# Patient Record
Sex: Male | Born: 1970 | Race: Black or African American | Hispanic: No | Marital: Single | Smoking: Current every day smoker
Health system: Southern US, Community
[De-identification: ages and names within clinical notes are randomized; demographics above are authoritative.]

## PROBLEM LIST (undated history)

## (undated) DIAGNOSIS — F1911 Other psychoactive substance abuse, in remission: Secondary | ICD-10-CM

## (undated) HISTORY — DX: Other psychoactive substance abuse, in remission: F19.11

## (undated) HISTORY — PX: INGUINAL HERNIA REPAIR: SUR1180

---

## 2007-02-05 ENCOUNTER — Emergency Department (HOSPITAL_COMMUNITY): Admission: EM | Admit: 2007-02-05 | Discharge: 2007-02-05 | Payer: Self-pay | Admitting: Emergency Medicine

## 2014-09-10 ENCOUNTER — Emergency Department (INDEPENDENT_AMBULATORY_CARE_PROVIDER_SITE_OTHER): Payer: Self-pay

## 2014-09-10 ENCOUNTER — Encounter (HOSPITAL_COMMUNITY): Payer: Self-pay | Admitting: Emergency Medicine

## 2014-09-10 ENCOUNTER — Emergency Department (INDEPENDENT_AMBULATORY_CARE_PROVIDER_SITE_OTHER)
Admission: EM | Admit: 2014-09-10 | Discharge: 2014-09-10 | Disposition: A | Discharge status: Home / Self Care | Payer: Self-pay | Source: Home / Self Care | Attending: Emergency Medicine | Admitting: Emergency Medicine

## 2014-09-10 DIAGNOSIS — J0140 Acute pansinusitis, unspecified: Secondary | ICD-10-CM

## 2014-09-10 DIAGNOSIS — J209 Acute bronchitis, unspecified: Secondary | ICD-10-CM

## 2014-09-10 MED ORDER — AZITHROMYCIN 250 MG PO TABS: ORAL_TABLET | ORAL | Status: DC

## 2014-09-10 MED ORDER — PREDNISONE 50 MG PO TABS: ORAL_TABLET | ORAL | Status: DC

## 2014-09-10 NOTE — ED Provider Notes (Signed)
CSN: [id number]     Arrival date & time 09/10/14  1857 History   First MD Initiated Contact with Patient 09/10/14 1951     Chief Complaint  Patient presents with  . URI   (Consider location/radiation/quality/duration/timing/severity/associated sxs/prior Treatment) HPI  He is a 44 year old man here for evaluation of cough. He states he's had a cold for the last 2 or 3 weeks. In the last few days he has developed bloody nasal discharge and headache. He also reports some blood-tinged sputum. He denies any chest pain or shortness of breath. He does report some fatigue. No fevers or chills. No nausea or vomiting. He is a current smoker, smoking 1 pack per day.  History reviewed. No pertinent past medical history. History reviewed. No pertinent past surgical history. No family history on file. History  Substance Use Topics  . Smoking status: Former Games developermoker  . Smokeless tobacco: Not on file  . Alcohol Use: 4.2 oz/week    7 Standard drinks or equivalent per week    Review of Systems  Constitutional: Positive for fatigue. Negative for fever, chills and appetite change.  HENT: Positive for congestion. Negative for rhinorrhea, sinus pressure and sore throat.   Respiratory: Positive for cough. Negative for shortness of breath.   Cardiovascular: Negative for chest pain.  Gastrointestinal: Negative for nausea and vomiting.  Musculoskeletal: Negative for myalgias.  Neurological: Positive for headaches.    Allergies  Review of patient's allergies indicates no known allergies.  Home Medications   Prior to Admission medications   Medication Sig Start Date End Date Taking? Authorizing Provider  azithromycin (ZITHROMAX Z-PAK) 250 MG tablet Take 2 pills today, then 1 pill daily until gone. 09/10/14   Charm RingsErin J Shinichi Anguiano, MD  predniSONE (DELTASONE) 50 MG tablet Take 1 pill daily for 5 days. 09/10/14   Charm RingsErin J Raunak Antuna, MD   BP 112/77 mmHg  Pulse 68  Temp(Src) 98.8 F (37.1 C) (Oral)  Resp 18  SpO2  97% Physical Exam  Constitutional: He is oriented to person, place, and time. He appears well-developed and well-nourished. No distress.  HENT:  Mouth/Throat: No oropharyngeal exudate.  Mild erythema of oropharynx. Nasal mucosa is erythematous and swollen.  Neck: Neck supple.  Cardiovascular: Normal rate, regular rhythm and normal heart sounds.   No murmur heard. Pulmonary/Chest: Effort normal. No respiratory distress. He has no wheezes. He has no rales.  Lymphadenopathy:    He has no cervical adenopathy.  Neurological: He is alert and oriented to person, place, and time.    ED Course  Procedures (including critical care time) Labs Review Labs Reviewed - No data to display  Imaging Review Dg Chest 2 View  09/10/2014   CLINICAL DATA:  For 3 weeks.  Head congestion.  Smoker.  EXAM: CHEST  2 VIEW  COMPARISON:  None.  FINDINGS: The heart size and mediastinal contours are within normal limits. Both lungs are clear. The visualized skeletal structures are unremarkable.  IMPRESSION: No active cardiopulmonary disease.   Electronically Signed   By: Burman NievesWilliam  Stevens M.D.   On: 09/10/2014 20:59     MDM   1. Acute bronchitis, unspecified organism   2. Acute pansinusitis, recurrence not specified    Treat with prednisone and azithromycin. Discussed smoking cessation. Return precautions reviewed.    Charm RingsErin J Elmon Shader, MD 09/10/14 2103

## 2014-09-10 NOTE — ED Notes (Signed)
Patient c/o sx including rhinorrhea with episodes of epistaxis and a productive cough with phlegm and blood x 3 days. Patient is in NAD.

## 2014-09-10 NOTE — Discharge Instructions (Signed)
You have bronchitis and a sinus infection. Take prednisone and azithromycin as prescribed. Please stop smoking. If you develop difficulty breathing or fevers please go to the emergency room.   Acute Bronchitis Bronchitis is when the airways that extend from the windpipe into the lungs get red, puffy, and painful (inflamed). Bronchitis often causes thick spit (mucus) to develop. This leads to a cough. A cough is the most common symptom of bronchitis. In acute bronchitis, the condition usually begins suddenly and goes away over time (usually in 2 weeks). Smoking, allergies, and asthma can make bronchitis worse. Repeated episodes of bronchitis may cause more lung problems. HOME CARE  Rest.  Drink enough fluids to keep your pee (urine) clear or pale yellow (unless you need to limit fluids as told by your doctor).  Only take over-the-counter or prescription medicines as told by your doctor.  Avoid smoking and secondhand smoke. These can make bronchitis worse. If you are a smoker, think about using nicotine gum or skin patches. Quitting smoking will help your lungs heal faster.  Reduce the chance of getting bronchitis again by:  Washing your hands often.  Avoiding people with cold symptoms.  Trying not to touch your hands to your mouth, nose, or eyes.  Follow up with your doctor as told. GET HELP IF: Your symptoms do not improve after 1 week of treatment. Symptoms include:  Cough.  Fever.  Coughing up thick spit.  Body aches.  Chest congestion.  Chills.  Shortness of breath.  Sore throat. GET HELP RIGHT AWAY IF:   You have an increased fever.  You have chills.  You have severe shortness of breath.  You have bloody thick spit (sputum).  You throw up (vomit) often.  You lose too much body fluid (dehydration).  You have a severe headache.  You faint. MAKE SURE YOU:   Understand these instructions.  Will watch your condition.  Will get help right away if you  are not doing well or get worse. Document Released: 11/07/2007 Document Revised: 01/21/2013 Document Reviewed: 11/11/2012 [facility] Patient Information 2015 [place], [place]. This information is not intended to replace advice given to you by your health care provider. Make sure you discuss any questions you have with your health care provider.

## 2014-11-24 ENCOUNTER — Ambulatory Visit (INDEPENDENT_AMBULATORY_CARE_PROVIDER_SITE_OTHER): Payer: No Typology Code available for payment source | Admitting: Medical

## 2014-11-24 ENCOUNTER — Other Ambulatory Visit: Payer: Self-pay | Admitting: Medical

## 2014-11-24 ENCOUNTER — Encounter: Payer: Self-pay | Admitting: Medical

## 2014-11-24 VITALS — BP 112/70 | HR 85 | Temp 98.1°F | Resp 15 | Wt 221.0 lb

## 2014-11-24 DIAGNOSIS — A630 Anogenital (venereal) warts: Secondary | ICD-10-CM

## 2014-11-24 DIAGNOSIS — R310 Gross hematuria: Secondary | ICD-10-CM | POA: Diagnosis not present

## 2014-11-24 DIAGNOSIS — R634 Abnormal weight loss: Secondary | ICD-10-CM | POA: Diagnosis not present

## 2014-11-24 DIAGNOSIS — R042 Hemoptysis: Secondary | ICD-10-CM

## 2014-11-24 DIAGNOSIS — F17200 Nicotine dependence, unspecified, uncomplicated: Secondary | ICD-10-CM

## 2014-11-24 DIAGNOSIS — N62 Hypertrophy of breast: Secondary | ICD-10-CM

## 2014-11-24 LAB — COMPREHENSIVE METABOLIC PANEL
ALK PHOS: 96 U/L (ref 39–117)
ALT: 18 U/L (ref 0–53)
AST: 19 U/L (ref 0–37)
Albumin: 4.3 g/dL (ref 3.5–5.2)
BILIRUBIN TOTAL: 0.4 mg/dL (ref 0.2–1.2)
BUN: 8 mg/dL (ref 6–23)
CO2: 24 meq/L (ref 19–32)
CREATININE: 1.01 mg/dL (ref 0.50–1.35)
Calcium: 9.1 mg/dL (ref 8.4–10.5)
Chloride: 106 mEq/L (ref 96–112)
GLUCOSE: 79 mg/dL (ref 70–99)
Potassium: 4 mEq/L (ref 3.5–5.3)
Sodium: 142 mEq/L (ref 135–145)
TOTAL PROTEIN: 7 g/dL (ref 6.0–8.3)

## 2014-11-24 LAB — POCT URINALYSIS DIPSTICK
Bilirubin, UA: NEGATIVE
Blood, UA: NEGATIVE
GLUCOSE UA: NEGATIVE
Leukocytes, UA: NEGATIVE
NITRITE UA: NEGATIVE
PH UA: 6
Spec Grav, UA: >=1.03
UROBILINOGEN UA: 1

## 2014-11-24 LAB — CBC WITH DIFFERENTIAL/PLATELET
Basophils Absolute: 0 10*3/uL (ref 0.0–0.1)
Basophils Relative: 0 % (ref 0–1)
Eosinophils Absolute: 0.1 10*3/uL (ref 0.0–0.7)
Eosinophils Relative: 1 % (ref 0–5)
HEMATOCRIT: 44.5 % (ref 39.0–52.0)
HEMOGLOBIN: 15.1 g/dL (ref 13.0–17.0)
Lymphocytes Relative: 34 % (ref 12–46)
Lymphs Abs: 4 10*3/uL (ref 0.7–4.0)
MCH: 30.8 pg (ref 26.0–34.0)
MCHC: 33.9 g/dL (ref 30.0–36.0)
MCV: 90.6 fL (ref 78.0–100.0)
MONO ABS: 0.7 10*3/uL (ref 0.1–1.0)
MPV: 10.6 fL (ref 8.6–12.4)
Monocytes Relative: 6 % (ref 3–12)
NEUTROS ABS: 7 10*3/uL (ref 1.7–7.7)
Neutrophils Relative %: 59 % (ref 43–77)
PLATELETS: 266 10*3/uL (ref 150–400)
RBC: 4.91 MIL/uL (ref 4.22–5.81)
RDW: 14.7 % (ref 11.5–15.5)
WBC: 11.8 10*3/uL — AB (ref 4.0–10.5)

## 2014-11-24 LAB — TSH: TSH: 0.274 u[IU]/mL — ABNORMAL LOW (ref 0.350–4.500)

## 2014-11-24 NOTE — Progress Notes (Signed)
Subjective: Here as a new patient today.  I have seen his daughter as a patient.  He notes prior being healthy until recently, has some concerns.     Here today for concerns.  He notes weight loss in recent months.   Was 268 lb 03/2014.   He thinks he was around same weight 07/2014.  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  Doesn't exercise in general other than physical at work.   Exercised in the past when incarcerated to occupy time.    He does note coughing up blood a month ago.  It was blood tinged phlegm and nothing else.   He does urinating blood a few times about a month ago.  Still has cough now daily since several months.  Smokes about 2 packs per day.  Started back smoking about 9 months, but had quit 10 months . Been smoking 24 years, primarily was 1ppd for 5 of the years, and was less than that for most of the time.     Has a big mole of right side of genital area that has gotten much bigger this past years, bled at one point, saw a doctor and was given cream to put on there.    Review of Systems Constitutional: -fever, -chills, +sweats, +unexpected weight change,+fatigue ENT: -runny nose, -ear pain, -sore throat Cardiology:  -chest pain, -palpitations, -edema Respiratory: +cough, -shortness of breath, -wheezing Gastroenterology: +hungry a lot, -abdominal pain, -nausea, -vomiting, -diarrhea, -constipation  Hematology: -bleeding or bruising problems Musculoskeletal: +leg cramping every night, intermittent, low back pain, no joint swelling Ophthalmology: -vision changes Urology: -dysuria, -difficulty urinating, +hematuria, -urinary frequency, -urgency Neurology: -headache, -weakness, -tingling, -numbness   History reviewed. No pertinent past medical history.  Past Surgical History  Procedure Laterality Date  . Inguinal hernia repair      right, as child    History   Social History  .  Marital Status: Single    Spouse Name: N/A  . Number of Children: N/A  . Years of Education: N/A   Occupational History  . Not on file.   Social History Main Topics  . Smoking status: Current Every Day Smoker  . Smokeless tobacco: Not on file  . Alcohol Use: 4.2 oz/week    7 Standard drinks or equivalent per week  . Drug Use: Yes    Special: Marijuana  . Sexual Activity: Not on file   Other Topics Concern  . Not on file   Social History Narrative   Married, has daughter, drinks 1/5th of liquor every 2 days, Christian, Psychologist, prison and probation services.      Family History  Problem Relation Age of Onset  . Other Father     AIDS?  . Drug abuse Sister     died of overdose     Current outpatient prescriptions:  .  azithromycin (ZITHROMAX Z-PAK) 250 MG tablet, Take 2 pills today, then 1 pill daily until gone. (Patient not taking: Reported on 11/24/2014), Disp: 6 tablet, Rfl: 0 .  predniSONE (DELTASONE) 50 MG tablet, Take 1 pill daily for 5 days. (Patient not taking: Reported on 11/24/2014), Disp: 5 tablet, Rfl: 0  No Known Allergies     Objective: BP 112/70 mmHg  Pulse 85  Temp(Src) 98.1 F (36.7 C) (Oral)  Resp 15  Wt 221 lb (100.245 kg)  General appearance: alert, no distress, WD/WN, AA male HEENT: normocephalic, sclerae anicteric, TMs pearly, nares patent,  no discharge or erythema, pharynx normal Oral cavity: MMM, no lesions Neck: supple, no lymphadenopathy, no thyromegaly, no masses Heart: RRR, normal S1, S2, no murmurs Lungs: CTA bilaterally, no wheezes, rhonchi, or rales Abdomen: +bs, soft, non tender, non distended, no masses, no hepatomegaly, no splenomegaly Pulses: 2+ symmetric, upper and lower extremities, normal cap refill GU: right inguinal surgical scar, large pedunculated 4cm x 5cm verrucal lesion with 3cm base of right groin adjacent to shaft of penis, circumcised, no testicular mass, no hernia, no swelling, no other rash or skin lesion DRE: small external  hemorrhoid, prostate seems a little enlarged but nodules.  Occult negative stool Skin: there is a patch flat small brown macule on right buttock posteriorly, 3-82mm diameter, probable birth mark per patient   Assessment: Encounter Diagnoses  Name Primary?  Marland Kitchen Unexplained weight loss Yes  . Gross hematuria   . Hemoptysis   . Heavy smoker   . Genital warts      Plan: discussed his symptoms, unexplained weight loss,gross hematuria, hemoptysis, risk factors for worrisome etiologies given AA heavy smoker and somewhat frequent alcohol use.   Need to rule out malignancy. Strongly advised he STOP tobacco and cut back alcohol significantly.  Labs, urine and CXR today.  Will likely need other eval as well.  F/u pending initial tests.   Genital warts - can address this at later date, but there is a large right warty lesion adjacent to base of penis suggestive of viral wart.

## 2014-11-25 ENCOUNTER — Other Ambulatory Visit: Payer: Self-pay | Admitting: Medical

## 2014-11-25 DIAGNOSIS — R946 Abnormal results of thyroid function studies: Secondary | ICD-10-CM

## 2014-11-25 LAB — PSA: PSA: 0.71 ng/mL (ref <=4.00)

## 2014-11-26 LAB — T4, FREE: Free T4: 1.04 ng/dL (ref 0.80–1.80)

## 2014-12-09 ENCOUNTER — Ambulatory Visit
Admission: RE | Admit: 2014-12-09 | Discharge: 2014-12-09 | Disposition: A | Discharge status: Home / Self Care | Payer: No Typology Code available for payment source | Source: Ambulatory Visit | Attending: Medical | Admitting: Medical

## 2014-12-09 DIAGNOSIS — R042 Hemoptysis: Secondary | ICD-10-CM

## 2014-12-09 DIAGNOSIS — F17200 Nicotine dependence, unspecified, uncomplicated: Secondary | ICD-10-CM

## 2014-12-09 DIAGNOSIS — A630 Anogenital (venereal) warts: Secondary | ICD-10-CM

## 2014-12-09 DIAGNOSIS — R310 Gross hematuria: Secondary | ICD-10-CM

## 2014-12-09 DIAGNOSIS — R634 Abnormal weight loss: Secondary | ICD-10-CM

## 2014-12-13 ENCOUNTER — Ambulatory Visit (INDEPENDENT_AMBULATORY_CARE_PROVIDER_SITE_OTHER): Payer: No Typology Code available for payment source | Admitting: Medical

## 2014-12-13 ENCOUNTER — Encounter: Payer: Self-pay | Admitting: Medical

## 2014-12-13 VITALS — BP 122/84 | HR 76 | Temp 98.0°F | Resp 15 | Wt 219.0 lb

## 2014-12-13 DIAGNOSIS — R059 Cough, unspecified: Secondary | ICD-10-CM

## 2014-12-13 DIAGNOSIS — R319 Hematuria, unspecified: Secondary | ICD-10-CM

## 2014-12-13 DIAGNOSIS — N62 Hypertrophy of breast: Secondary | ICD-10-CM

## 2014-12-13 DIAGNOSIS — R634 Abnormal weight loss: Secondary | ICD-10-CM | POA: Diagnosis not present

## 2014-12-13 DIAGNOSIS — R05 Cough: Secondary | ICD-10-CM | POA: Diagnosis not present

## 2014-12-13 LAB — TSH: TSH: 0.396 u[IU]/mL (ref 0.350–4.500)

## 2014-12-13 LAB — POCT URINALYSIS DIPSTICK
Bilirubin, UA: NEGATIVE
Glucose, UA: NEGATIVE
Ketones, UA: NEGATIVE
Leukocytes, UA: NEGATIVE
Nitrite, UA: NEGATIVE
RBC UA: NEGATIVE
Spec Grav, UA: >=1.03
Urobilinogen, UA: 0.2
pH, UA: 6

## 2014-12-13 LAB — T4, FREE: Free T4: 0.87 ng/dL (ref 0.80–1.80)

## 2014-12-13 NOTE — Progress Notes (Signed)
Subjective: Here for recheck on unexplained weight loss . At last visit we had checked some labs and chest xray, white count was elevated, TSH decreased, but rest of labs were ok.  He notes weight loss in recent months.   Was 268 lb 03/2014.   He thinks he was around same weight 07/2014.  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  Doesn't exercise in general other than physical at work.   Exercised in the past when incarcerated to occupy time.   He does note coughing up blood a 2 months ago.  It was blood tinged phlegm and nothing else.   He does urinating blood a few times about a month ago.  Still has cough now daily since several months.  Smokes about 2 packs per day.  Started back smoking about 9 months, but had quit 10 months . Been smoking 24 years, primarily was 1ppd for 5 of the years, and was less than that for most of the time.   Since last visit has ongoing cough, but no residual hemoptysis or blood in urine.  No other aggravating or relieving factors. No other complaint.  Review of Systems Constitutional: -fever, -chills, +sweats, +unexpected weight change,+fatigue ENT: -runny nose, -ear pain, -sore throat Cardiology:  -chest pain, -palpitations, -edema Respiratory: +cough, -shortness of breath, -wheezing Gastroenterology: +hungry a lot, -abdominal pain, -nausea, -vomiting, -diarrhea, -constipation  Hematology: -bleeding or bruising problems Musculoskeletal: +leg cramping every night, intermittent, low back pain, no joint swelling Ophthalmology: -vision changes Urology: -dysuria, -difficulty urinating, +hematuria, -urinary frequency, -urgency Neurology: -headache, -weakness, -tingling, -numbness   No past medical history on file.  Past Surgical History  Procedure Laterality Date  . Inguinal hernia repair      right, as child    History   Social History  . Marital Status: Single    Spouse Name:  N/A  . Number of Children: N/A  . Years of Education: N/A   Occupational History  . Not on file.   Social History Main Topics  . Smoking status: Current Every Day Smoker  . Smokeless tobacco: Not on file  . Alcohol Use: 4.2 oz/week    7 Standard drinks or equivalent per week  . Drug Use: Yes    Special: Marijuana  . Sexual Activity: Not on file   Other Topics Concern  . Not on file   Social History Narrative   Married, has daughter, drinks 1/5th of liquor every 2 days, Christian, Psychologist, prison and probation services.      Family History  Problem Relation Age of Onset  . Other Father     AIDS?  . Drug abuse Sister     died of overdose    No current outpatient prescriptions on file.  No Known Allergies     Objective: BP 122/84 mmHg  Pulse 76  Temp(Src) 98 F (36.7 C) (Oral)  Resp 15  Wt 219 lb (99.338 kg)  Wt Readings from Last 3 Encounters:  12/13/14 219 lb (99.338 kg)  11/24/14 221 lb (100.245 kg)   General appearance: alert, no distress, WD/WN, AA male HEENT: normocephalic, sclerae anicteric, TMs pearly, nares patent, no discharge or erythema, pharynx normal Oral cavity: MMM, no lesions Neck: supple, no lymphadenopathy, no thyromegaly, no masses Heart: RRR, normal S1, S2, no murmurs Lungs: CTA bilaterally, no wheezes, rhonchi, or rales Abdomen: +bs, soft, non tender, non distended, no masses, no hepatomegaly, no splenomegaly Pulses: 2+ symmetric,  upper and lower extremities, normal cap refill Breasts - fatty tissue of both breasts under areola suggestive of gynecomastia, otherwise no nodules or lumps, no axillary or supraclavicular lymphadenopathy  Assessment: Encounter Diagnoses  Name Primary?  . Loss of weight Yes  . Gynecomastia   . Blood in urine   . Cough     Plan: Discussed his symptoms, unexplained weight loss,gross hematuria, hemoptysis, risk factors for worrisome etiologies given AA heavy smoker and somewhat frequent alcohol use.  Reviewed chest xray and  labs from last visit.  Strongly advised he STOP tobacco and cut back alcohol significantly. additional labs today, and consider mammogram, consider pulmonology referral, other imaging such as CT Chest/abdomen/pelvis.   F/u pending labs.  Robert Phelps was seen today for follow up weight loss and labs.  Diagnoses and all orders for this visit:  Loss of weight Orders: -     POCT urinalysis dipstick -     CBC -     T4, Free -     TSH -     FSH/LH -     Prolactin -     Testosterone  Gynecomastia Orders: -     POCT urinalysis dipstick -     CBC -     T4, Free -     TSH -     FSH/LH -     Prolactin -     Testosterone  Blood in urine Orders: -     POCT urinalysis dipstick -     CBC -     T4, Free -     TSH -     FSH/LH -     Prolactin -     Testosterone  Cough

## 2014-12-14 ENCOUNTER — Other Ambulatory Visit: Payer: Self-pay | Admitting: Medical

## 2014-12-14 DIAGNOSIS — R059 Cough, unspecified: Secondary | ICD-10-CM

## 2014-12-14 DIAGNOSIS — R05 Cough: Secondary | ICD-10-CM

## 2014-12-14 DIAGNOSIS — F17200 Nicotine dependence, unspecified, uncomplicated: Secondary | ICD-10-CM

## 2014-12-14 DIAGNOSIS — R042 Hemoptysis: Secondary | ICD-10-CM

## 2014-12-14 DIAGNOSIS — R634 Abnormal weight loss: Secondary | ICD-10-CM

## 2014-12-14 LAB — FSH/LH
FSH: 1.1 m[IU]/mL — AB (ref 1.4–18.1)
LH: 4.1 m[IU]/mL (ref 1.5–9.3)

## 2014-12-14 LAB — CBC
HCT: 46.6 % (ref 39.0–52.0)
Hemoglobin: 15.7 g/dL (ref 13.0–17.0)
MCH: 31.4 pg (ref 26.0–34.0)
MCHC: 33.7 g/dL (ref 30.0–36.0)
MCV: 93.2 fL (ref 78.0–100.0)
MPV: 10.4 fL (ref 8.6–12.4)
Platelets: 240 10*3/uL (ref 150–400)
RBC: 5 MIL/uL (ref 4.22–5.81)
RDW: 14.7 % (ref 11.5–15.5)
WBC: 10.8 10*3/uL — ABNORMAL HIGH (ref 4.0–10.5)

## 2014-12-14 LAB — PROLACTIN: Prolactin: 18 ng/mL — ABNORMAL HIGH (ref 2.1–17.1)

## 2014-12-14 LAB — TESTOSTERONE: TESTOSTERONE: 215 ng/dL — AB (ref 300–890)

## 2014-12-17 ENCOUNTER — Other Ambulatory Visit: Payer: No Typology Code available for payment source

## 2014-12-24 ENCOUNTER — Telehealth: Payer: Self-pay | Admitting: Medical

## 2014-12-24 NOTE — Telephone Encounter (Signed)
ALERT PT HAS NEW PHARMACY/Pt called and stated that he is still having issues. He was unable to go for test because insurance would not cover. He is requesting a antibiotic be called in for him. Pt's NEW PHARMACY IS [facility] on [address]. Pt can be reached at [phone].

## 2014-12-27 ENCOUNTER — Other Ambulatory Visit: Payer: Self-pay | Admitting: Medical

## 2014-12-27 MED ORDER — AZITHROMYCIN 250 MG PO TABS: ORAL_TABLET | ORAL | Status: DC

## 2014-12-27 NOTE — Telephone Encounter (Signed)
I sent antibiotic, but still don't have an answer on his symptoms   Insurance should cover the scan, the copay is probably the issue.     If we haven't done a TB skin test, we should do this as well.   See if the copay was the issue.  If so, we may need to refer to specialist or other plan.

## 2014-12-27 NOTE — Telephone Encounter (Signed)
Called pt and pt states that his deductible has not been met yet so that's why insurance will not pay for the scan. He had TB last august and it was negative. Please advise what the next step is and let pt know

## 2014-12-28 NOTE — Telephone Encounter (Signed)
LMOM TO CB. CLS 

## 2014-12-28 NOTE — Telephone Encounter (Signed)
I recommend he return for updated TB skin test, and he will have to decide on spending the money to meet the deductible to pay for the test.  If he is still losing weight, and assuming the TB skin test is negative, then we still have work to do to figure out why.

## 2014-12-29 NOTE — Telephone Encounter (Signed)
I can't move forward with my plan if he isn't able to or willing to get the additional imaging.  So at this point, lets refer to pulmonology given the pulmonary symptoms.  If they don't find any worrisome issues, then we may need to go a different route.    He needs to keep in mind that he has several abnormal labs and studies that don't necessarily point to just 1 cause.  His symptoms are worrisome too, although the coughing up blood had resolved.  Is he still losing weight or has this stabilized?  Refer to pulm

## 2014-12-29 NOTE — Telephone Encounter (Signed)
Patient is aware of the message from [facility] and the patient states that he doesn't want to nor does he think it is necessary to do a TB skin test. He states that he had the TB skin done less than a year ago. Patient doesn't understand why its taking several visit to figure out what is wrong. Patient states that he is not one to keep running to the doctor

## 2014-12-30 ENCOUNTER — Other Ambulatory Visit: Payer: Self-pay

## 2014-12-30 DIAGNOSIS — R05 Cough: Secondary | ICD-10-CM

## 2014-12-30 DIAGNOSIS — R059 Cough, unspecified: Secondary | ICD-10-CM

## 2014-12-30 NOTE — Telephone Encounter (Signed)
LM to [facility] 

## 2014-12-30 NOTE — Telephone Encounter (Signed)
Reported and scheduled in [facility] pulmon referral. He does not know what his weight is currently. He does not check.

## 2015-01-14 ENCOUNTER — Ambulatory Visit (INDEPENDENT_AMBULATORY_CARE_PROVIDER_SITE_OTHER): Payer: No Typology Code available for payment source | Admitting: Internal Medicine

## 2015-01-14 ENCOUNTER — Encounter: Payer: Self-pay | Admitting: Internal Medicine

## 2015-01-14 VITALS — BP 122/80 | HR 82 | Ht 74.0 in | Wt 206.4 lb

## 2015-01-14 DIAGNOSIS — Z720 Tobacco use: Secondary | ICD-10-CM | POA: Diagnosis not present

## 2015-01-14 DIAGNOSIS — E221 Hyperprolactinemia: Secondary | ICD-10-CM

## 2015-01-14 DIAGNOSIS — R05 Cough: Secondary | ICD-10-CM | POA: Diagnosis not present

## 2015-01-14 DIAGNOSIS — F17210 Nicotine dependence, cigarettes, uncomplicated: Secondary | ICD-10-CM

## 2015-01-14 DIAGNOSIS — R059 Cough, unspecified: Secondary | ICD-10-CM

## 2015-01-14 MED ORDER — PREDNISONE 10 MG PO TABS: ORAL_TABLET | ORAL | Status: DC

## 2015-01-14 MED ORDER — OMEPRAZOLE MAGNESIUM 20 MG PO TBEC: 20.0000 mg | DELAYED_RELEASE_TABLET | Freq: Every day | ORAL | Status: DC

## 2015-01-14 MED ORDER — FAMOTIDINE 20 MG PO TABS: ORAL_TABLET | ORAL | Status: DC

## 2015-01-14 NOTE — Patient Instructions (Addendum)
Try prilosec otc   Take 30-60 min before first meal of the day and Pepcid ac (famotidine) 20 mg one @  bedtime until cough is completely gone for at least a week without the need for cough suppression  Please see patient coordinator before you leave today  to schedule Head CT with contrast to eval pituitary and sinuses   GERD (REFLUX)  is an extremely common cause of respiratory symptoms just like yours , many times with no obvious heartburn at all.    It can be treated with medication, but also with lifestyle changes including elevation of the head of your bed (ideally with 6 inch  bed blocks),  Smoking cessation, avoidance of late meals, excessive alcohol, and avoid fatty foods, chocolate, peppermint, colas, red wine, and acidic juices such as orange juice.  NO MINT OR MENTHOL PRODUCTS SO NO COUGH DROPS  USE SUGARLESS CANDY INSTEAD (Jolley ranchers or Stover's or Life Savers) or even ice chips will also do - the key is to swallow to prevent all throat clearing. NO OIL BASED VITAMINS - use powdered substitutes.   Finish up your antibiotics  Prednisone 10 mg take  4 each am x 2 days,   2 each am x 2 days,  1 each am x 2 days and stop   For cough > mucinex dm 1200 mg every 12 hours as needed   Late add: reports HIV checked in our system but I don't see it > will contact [facility] to verify and needs one if not as has been in prison

## 2015-01-14 NOTE — Progress Notes (Signed)
   Subjective:    Patient ID: Robert Phelps, male    DOB: [birth year],    MRN: [id number]  HPI  10 yobm active smoker very healthy athletic until 08/2014 with cough/low grade hemoptysis with neg cxr > referred to pulmonary clinic 01/14/2015 by Dr Kristian Covey.   01/14/2015 1st [facility] office visit/ Maxon Kresse   Chief Complaint  Patient presents with  . Pulmonary Consult    Referred by Dr. Aleen Campi. Pt c/o cough and unintended wt loss since March 2016.  Cough is prod with yellow sputum. He did have some hemoptysis 2 months ago. Cough seems worse at night.   cough worse at hs / early am with < tsp at wakening and no longer bloody/ sleep poorly with leg pains  New breast enlargement with pain = both sides no discharge with elevated prolactin noted Just started abx one day prior to OV    No obvious other patterns in day to day or daytime variabilty or assoc sob  or cp or chest tightness, subjective wheeze overt sinus or hb symptoms. No unusual exp hx or h/o childhood pna/ asthma or knowledge of premature birth.  Sleeping ok without nocturnal  or early am exacerbation  of respiratory  c/o's or need for noct saba. Also denies any obvious fluctuation of symptoms with weather or environmental changes or other aggravating or alleviating factors except as outlined above   Current Medications, Allergies, Complete Past Medical History, Past Surgical History, Family History, and Social History were reviewed in [employer] record.            Review of Systems  Constitutional: Positive for appetite change and unexpected weight change. Negative for fever, chills and activity change.  HENT: Positive for congestion and dental problem. Negative for postnasal drip, rhinorrhea, sneezing, sore throat, trouble swallowing and voice change.   Eyes: Negative for visual disturbance.  Respiratory: Positive for cough. Negative for choking and shortness of breath.   Cardiovascular:  Negative for chest pain and leg swelling.  Gastrointestinal: Negative for nausea, vomiting and abdominal pain.  Genitourinary: Negative for difficulty urinating.  Musculoskeletal: Positive for arthralgias.  Skin: Negative for rash.  Psychiatric/Behavioral: Negative for behavioral problems and confusion.       Objective:   Physical Exam  Depressed appearing bm with hopeless helpless affect  Wt Readings from Last 3 Encounters:  01/14/15 206 lb 6.4 oz (93.622 kg)  12/13/14 219 lb (99.338 kg)  11/24/14 221 lb (100.245 kg)    Vital signs reviewed  HEENT: nl dentition, turbinates, and orophanx. Nl external ear canals without cough reflex   NECK :  without JVD/Nodes/TM/ nl carotid upstrokes bilaterally   LUNGS: no acc muscle use,  Minimal coarsening in bases s true rhonchi or cough on insp/exp   CV:  RRR  no s3 or murmur or increase in P2, no edema   ABD:  soft and nontender with nl excursion in the supine position. No bruits or organomegaly, bowel sounds nl  MS:  warm without deformities, calf tenderness, cyanosis or clubbing  SKIN: warm and dry without lesions    NEURO:  alert, approp, no deficits     I personally reviewed images and agree with radiology impression as follows:  CXR:  12/09/14 No acute cardiopulmonary disease.              Assessment & Plan:

## 2015-01-15 ENCOUNTER — Encounter: Payer: Self-pay | Admitting: Internal Medicine

## 2015-01-15 DIAGNOSIS — R05 Cough: Secondary | ICD-10-CM | POA: Insufficient documentation

## 2015-01-15 DIAGNOSIS — F17210 Nicotine dependence, cigarettes, uncomplicated: Secondary | ICD-10-CM | POA: Insufficient documentation

## 2015-01-15 DIAGNOSIS — R059 Cough, unspecified: Secondary | ICD-10-CM | POA: Insufficient documentation

## 2015-01-15 NOTE — Assessment & Plan Note (Signed)
Assoc with gynecomastia > rec Head ct to r/o pituitary tumor/ consider endocrinology eval per primary care.

## 2015-01-15 NOTE — Assessment & Plan Note (Addendum)
The most common causes of chronic cough in immunocompetent adults include the following: upper airway cough syndrome (UACS), previously referred to as postnasal drip syndrome (PNDS), which is caused by variety of rhinosinus conditions; (2) asthma; (3) GERD; (4) chronic bronchitis from cigarette smoking or other inhaled environmental irritants; (5) nonasthmatic eosinophilic bronchitis; and (6) bronchiectasis.   These conditions, singly or in combination, have accounted for up to 94% of the causes of chronic cough in prospective studies.   Other conditions have constituted no >6% of the causes in prospective studies These have included bronchogenic carcinoma, chronic interstitial pneumonia, sarcoidosis, left ventricular failure, ACEI-induced cough, and aspiration from a condition associated with pharyngeal dysfunction.    Chronic cough is often simultaneously caused by more than one condition. A single cause has been found from 38 to 82% of the time, multiple causes from 18 to 62%. Multiply caused cough has been the result of three diseases up to 42% of the time.       Based on hx and exam, this is most likely:  Classic Upper airway cough syndrome, so named because it's frequently impossible to sort out how much is  CR/sinusitis with freq throat clearing (which can be related to primary GERD)   vs  causing  secondary (" extra esophageal")  GERD from wide swings in gastric pressure that occur with throat clearing, often  promoting self use of mint and menthol lozenges that reduce the lower esophageal sphincter tone and exacerbate the problem further in a cyclical fashion.   These are the same pts (now being labeled as having "irritable larynx syndrome" by some cough centers) who not infrequently have a history of having failed to tolerate ace inhibitors,  dry powder inhalers or biphosphonates or report having atypical reflux symptoms that don't respond to standard doses of PPI , and are easily confused as  having aecopd or asthma flares by even experienced allergists/ pulmonologists.   The first step is to maximize acid suppression and eliminate cyclical coughing with mucinex dm  then regroup if the cough persists.  I had an extended discussion with the patient reviewing all relevant studies completed to date and  lasting 35 min  1) Explained: The standardized cough guidelines published in Chest by [place] in 2006 are still the best available and consist of a multiple step process (up to 12!) , not a single office visit,  and are intended  to address this problem logically,  with an alogrithm dependent on response to empiric treatment at  each progressive step  to determine a specific diagnosis with  minimal addtional testing needed. Therefore if adherence is an issue or can't be accurately verified,  it's very unlikely the standard evaluation and treatment will be successful here.    Furthermore, response to therapy (other than acute cough suppression, which should only be used short term with avoidance of narcotic containing cough syrups if possible), can be a gradual process for which the patient may perceive immediate benefit.  Unlike going to an eye doctor where the best perscription is almost always the first one and is immediately effective, this is almost never the case in the management of chronic cough syndromes. Therefore the patient needs to commit up front to consistently adhere to recommendations  for up to 6 weeks of therapy directed at the likely underlying problem(s) before the response can be reasonably evaluated.     2) Each maintenance medication was reviewed in detail including most importantly the difference between maintenance and  prns and under what circumstances the prns are to be triggered using an action plan format that is not reflected in the computer generated alphabetically organized AVS.    Please see instructions for details which were reviewed in writing and the  patient given a copy highlighting the part that I personally wrote and discussed at today's ov.   See instructions for specific recommendations which were reviewed directly with the patient who was given a copy with highlighter outlining the key components.     Late add: reports HIV checked in our system but I don't see it > will contact S Tysinger to verify and needs one if not as has been in prison

## 2015-01-15 NOTE — Assessment & Plan Note (Signed)

## 2015-01-17 ENCOUNTER — Telehealth: Payer: Self-pay | Admitting: Internal Medicine

## 2015-01-17 ENCOUNTER — Inpatient Hospital Stay: Admission: RE | Admit: 2015-01-17 | Payer: No Typology Code available for payment source | Source: Ambulatory Visit

## 2015-01-17 DIAGNOSIS — R05 Cough: Secondary | ICD-10-CM

## 2015-01-17 DIAGNOSIS — R059 Cough, unspecified: Secondary | ICD-10-CM

## 2015-01-17 DIAGNOSIS — E221 Hyperprolactinemia: Secondary | ICD-10-CM

## 2015-01-17 MED ORDER — PREDNISONE 10 MG PO TABS: ORAL_TABLET | ORAL | Status: DC

## 2015-01-17 NOTE — Telephone Encounter (Signed)
FYI for MW. 

## 2015-01-17 NOTE — Telephone Encounter (Signed)
Spoke with pt, states his prednisone was never sent to the pharmacy.  Resent this rx to preferred pharmacy.  Nothing further needed.

## 2015-01-17 NOTE — Telephone Encounter (Signed)
Aware, will notify his primary

## 2015-08-04 ENCOUNTER — Observation Stay (HOSPITAL_COMMUNITY)
Admission: EM | Admit: 2015-08-04 | Discharge: 2015-08-05 | Discharge status: Against Medical Advice | Payer: Self-pay | Attending: Internal Medicine | Admitting: Internal Medicine

## 2015-08-04 ENCOUNTER — Encounter (HOSPITAL_COMMUNITY): Payer: Self-pay | Admitting: Emergency Medicine

## 2015-08-04 DIAGNOSIS — I959 Hypotension, unspecified: Secondary | ICD-10-CM | POA: Insufficient documentation

## 2015-08-04 DIAGNOSIS — T401X1A Poisoning by heroin, accidental (unintentional), initial encounter: Principal | ICD-10-CM | POA: Insufficient documentation

## 2015-08-04 DIAGNOSIS — F17210 Nicotine dependence, cigarettes, uncomplicated: Secondary | ICD-10-CM | POA: Insufficient documentation

## 2015-08-04 DIAGNOSIS — D72829 Elevated white blood cell count, unspecified: Secondary | ICD-10-CM | POA: Insufficient documentation

## 2015-08-04 DIAGNOSIS — F1910 Other psychoactive substance abuse, uncomplicated: Secondary | ICD-10-CM | POA: Insufficient documentation

## 2015-08-04 DIAGNOSIS — Z5321 Procedure and treatment not carried out due to patient leaving prior to being seen by health care provider: Secondary | ICD-10-CM | POA: Insufficient documentation

## 2015-08-04 DIAGNOSIS — R55 Syncope and collapse: Secondary | ICD-10-CM | POA: Insufficient documentation

## 2015-08-04 MED ORDER — ACETAMINOPHEN 500 MG PO TABS
1000.0000 mg | ORAL_TABLET | Freq: Once | ORAL | Status: AC
  Administered 2015-08-04: 1000 mg via ORAL
  Filled 2015-08-04: qty 2

## 2015-08-04 NOTE — ED Notes (Addendum)
Per EMS patient was found respiratory arrest - patient was not breathing at all. Patient given 1 mg Narcan IN with no response. 20 G in Left AC and given 2 mg Narcan IV. The only verbal response given by patient since the narcan is a grunting noise. Patient had been drinking alcohol and smoking marijuana. Patient is known to have used heroine in the past, but friends/family unaware that patient was still using. Last seen awake was 2 hours ago. Patient's friend thought he was sleeping. Patient's oxygen saturation was at 40% upon EMS arrival. EMS unable to obtain further vital signs after Narcan administration. EMS held non-rebreather near his face. Patient was not keeping the nonrebreathing on - was biting the mask.

## 2015-08-04 NOTE — ED Notes (Signed)
MD at bedside. 

## 2015-08-04 NOTE — ED Notes (Signed)
Bed: UJ81 Expected date: 08/04/15 Expected time: 10:04 PM Means of arrival: Ambulance Comments: 45 yo M  Heroin overdose

## 2015-08-04 NOTE — ED Provider Notes (Addendum)
By signing my name below, I, Robert Phelps, attest that this documentation has been prepared under the direction and in the presence of Robert Fontanilla N Jarious Lyon, DO. Electronically Signed: Bethel Phelps, ED Scribe. 08/04/2015. 11:46 PM.  TIME SEEN: 11:39 PM  CHIEF COMPLAINT: heroin overdose   HPI: Robert Phelps is a 45 y.o. male who presents to the Emergency Department complaining of unintentional heroin overdose. 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 Pt was found unresponsive by family and had oxygen saturation at 40% on EMS arrival. He was given 1 mg of Narcan intranasally with no response and then 2 mg IV. At present he has cramping BLE pain and a mild frontal throbbing headache but denies recent head injury, chest pain, SOB, or other complaint. Denies nausea, vomiting or diarrhea. Pt denies SI and any intentional self harm tonight. He also uses marijuana but denies alcohol and other illicit drug use. Denies history of IV drug abuse.  ROS: See HPI Constitutional: no fever  Eyes: no drainage  ENT: no runny nose   Cardiovascular:  no chest pain  Resp: no SOB  GI: no vomiting GU: no dysuria Integumentary: no rash  Allergy: no hives  Musculoskeletal: no leg swelling  Neurological: no slurred speech ROS otherwise negative  PAST MEDICAL HISTORY/PAST SURGICAL HISTORY:  History reviewed. No pertinent past medical history.  MEDICATIONS:  Prior to Admission medications   Medication Sig Start Date End Date Taking? Authorizing Provider  famotidine (PEPCID) 20 MG tablet One at bedtime Patient not taking: Reported on 08/04/2015 01/14/15   Robert Cowden, MD  omeprazole (PRILOSEC OTC) 20 MG tablet Take 1 tablet (20 mg total) by mouth daily. Patient not taking: Reported on 08/04/2015 01/14/15   Robert Cowden, MD    ALLERGIES:  No Known Allergies  SOCIAL HISTORY:  Social History  Substance Use Topics  . Smoking status: Current Every Day Smoker -- 1.50  packs/day for 23 years    Types: Cigarettes  . Smokeless tobacco: Not on file  . Alcohol Use: 4.2 oz/week    7 Standard drinks or equivalent per week    FAMILY HISTORY: Family History  Problem Relation Age of Onset  . Other Father     AIDS?  . Drug abuse Sister     died of overdose    EXAM: BP 107/71 mmHg  Pulse 64  Temp(Src) 97.5 F (36.4 C) (Oral)  Resp 24  SpO2 96% CONSTITUTIONAL: Alert and oriented 3 and responds appropriately to questions. Well-appearing; well-nourished, afebrile nontoxic HEAD: Normocephalic, atraumatic EYES: Conjunctivae clear, PERRL ENT: normal nose; no rhinorrhea; moist mucous membranes; pharynx without lesions noted NECK: Supple, no meningismus, no LAD, No midline spinal tenderness, step off or deformity  CARD: Regular and tachycardic; S1 and S2 appreciated; no murmurs, no clicks, no rubs, no gallops RESP: Normal chest excursion without splinting or tachypnea; breath sounds clear and equal bilaterally; no wheezes, no rhonchi, no rales, no hypoxia or respiratory distress, speaking full sentences ABD/GI: Normal bowel sounds; non-distended; soft, non-tender, no rebound, no guarding, no peritoneal signs BACK:  The back appears normal and is non-tender to palpation, there is no CVA tenderness,  No midline spinal tenderness, step off or deformity  EXT: Normal ROM in all joints; non-tender to palpation; no edema; normal capillary refill; no cyanosis, no calf tenderness or swelling    SKIN: Normal color for age and race; warm, no rash NEURO: Moves all extremities equally, sensation to light touch intact diffusely,  cranial nerves II through XII intact PSYCH: The patient's mood and manner are appropriate. Grooming and personal hygiene are appropriate. Denies SI, HI.  MEDICAL DECISION MAKING: Patient here with intentional heroin overdose. Patient was found unresponsive by his wife. EMS found him with oxygen saturation in the 40s. Given intranasal Narcan without  relief. Then given IV Narcan and is back to baseline. Patient is mildly tachycardic and hypotensive. No fever. We'll obtain labs and give IV fluids. Continue to monitor patient.  ED PROGRESS: Labs show leukocytosis of 28,000 with left shift. This may be reactive but given his hypotension concern for possible infection. He does report several weeks of a cough. We'll obtain urine and chest x-ray. We'll also obtain cultures, lactate and continue to hydrate patient.   Urine shows no sign of infection. Urine drug is positive for opiates and THC. Lactate normal. Chest x-ray clear. Despite 2 L of IV fluids patient remains hypotensive. He appears his blood pressure is normally in the 110/70s to 120s/80s. He does not take blood pressure medication. Denies that he took anything other than heroin tonight. We'll give him IV vancomycin and Zosyn in case this is sepsis. Blood cultures are pending. Discussed with hospitalist Dr. Toniann Phelps for admission to observation, telemetry bed. I will place holding orders per his request. Care transferred to hospital service. Patient and wife updated with plan or comfortable with this plan. Patient does not have a PCP. Hospitalist is requesting a head CT.   CRITICAL CARE Performed by: Raelyn Number   Total critical care time: 45 minutes  Critical care time was exclusive of separately billable procedures and treating other patients.  Critical care was necessary to treat or prevent imminent or life-threatening deterioration.  Critical care was time spent personally by me on the following activities: development of treatment plan with patient and/or surrogate as well as nursing, discussions with consultants, evaluation of patient's response to treatment, examination of patient, obtaining history from patient or surrogate, ordering and performing treatments and interventions, ordering and review of laboratory studies, ordering and review of radiographic studies, pulse  oximetry and re-evaluation of patient's condition.    I personally performed the services described in this documentation, which was scribed in my presence. The recorded information has been reviewed and is accurate.      Layla Maw Marv Alfrey, DO 08/05/15 1610  Layla Maw Geanette Buonocore, DO 08/05/15 [phone]

## 2015-08-04 NOTE — ED Notes (Addendum)
Patient alert and oriented and speaking with nurse without difficulty.  Patient denies pain at present.  Communicating in clear and complete sentences.  Patient reports drinking alcohol and doing heroin today.

## 2015-08-05 ENCOUNTER — Emergency Department (HOSPITAL_COMMUNITY): Payer: Self-pay

## 2015-08-05 ENCOUNTER — Observation Stay (HOSPITAL_BASED_OUTPATIENT_CLINIC_OR_DEPARTMENT_OTHER): Payer: Self-pay

## 2015-08-05 ENCOUNTER — Observation Stay (HOSPITAL_COMMUNITY): Payer: Self-pay

## 2015-08-05 ENCOUNTER — Encounter (HOSPITAL_COMMUNITY): Payer: Self-pay | Admitting: Internal Medicine

## 2015-08-05 DIAGNOSIS — F1910 Other psychoactive substance abuse, uncomplicated: Secondary | ICD-10-CM | POA: Diagnosis present

## 2015-08-05 DIAGNOSIS — I959 Hypotension, unspecified: Secondary | ICD-10-CM

## 2015-08-05 DIAGNOSIS — R55 Syncope and collapse: Secondary | ICD-10-CM

## 2015-08-05 DIAGNOSIS — T401X1A Poisoning by heroin, accidental (unintentional), initial encounter: Secondary | ICD-10-CM

## 2015-08-05 LAB — CBC WITH DIFFERENTIAL/PLATELET
BASOS ABS: 0 10*3/uL (ref 0.0–0.1)
BASOS PCT: 0 %
Basophils Absolute: 0 10*3/uL (ref 0.0–0.1)
Basophils Relative: 0 %
EOS ABS: 0.1 10*3/uL (ref 0.0–0.7)
EOS PCT: 0 %
EOS PCT: 1 %
Eosinophils Absolute: 0 10*3/uL (ref 0.0–0.7)
HCT: 44.7 % (ref 39.0–52.0)
HCT: 37.6 % — ABNORMAL LOW (ref 39.0–52.0)
Hemoglobin: 15 g/dL (ref 13.0–17.0)
Hemoglobin: 12.4 g/dL — ABNORMAL LOW (ref 13.0–17.0)
LYMPHS ABS: 2.2 10*3/uL (ref 0.7–4.0)
Lymphocytes Relative: 8 %
Lymphocytes Relative: 31 %
Lymphs Abs: 4.4 10*3/uL — ABNORMAL HIGH (ref 0.7–4.0)
MCH: 31.6 pg (ref 26.0–34.0)
MCH: 31 pg (ref 26.0–34.0)
MCHC: 33.6 g/dL (ref 30.0–36.0)
MCHC: 33 g/dL (ref 30.0–36.0)
MCV: 94 fL (ref 78.0–100.0)
MCV: 94.3 fL (ref 78.0–100.0)
MONO ABS: 1.1 10*3/uL — AB (ref 0.1–1.0)
MONOS PCT: 8 %
Monocytes Absolute: 2.5 10*3/uL — ABNORMAL HIGH (ref 0.1–1.0)
Monocytes Relative: 9 %
NEUTROS ABS: 8.3 10*3/uL — AB (ref 1.7–7.7)
NEUTROS PCT: 83 %
Neutro Abs: 23.4 10*3/uL — ABNORMAL HIGH (ref 1.7–7.7)
Neutrophils Relative %: 60 %
PLATELETS: 222 10*3/uL (ref 150–400)
Platelets: 270 10*3/uL (ref 150–400)
RBC: 4 MIL/uL — ABNORMAL LOW (ref 4.22–5.81)
RBC: 4.74 MIL/uL (ref 4.22–5.81)
RDW: 13.6 % (ref 11.5–15.5)
RDW: 13.6 % (ref 11.5–15.5)
WBC MORPHOLOGY: INCREASED
WBC: 13.9 10*3/uL — ABNORMAL HIGH (ref 4.0–10.5)
WBC: 28.1 10*3/uL — AB (ref 4.0–10.5)

## 2015-08-05 LAB — COMPREHENSIVE METABOLIC PANEL
ALBUMIN: 3.1 g/dL — AB (ref 3.5–5.0)
ALK PHOS: 50 U/L (ref 38–126)
ALT: 42 U/L (ref 17–63)
ALT: 57 U/L (ref 17–63)
ANION GAP: 6 (ref 5–15)
AST: 40 U/L (ref 15–41)
AST: 62 U/L — AB (ref 15–41)
Albumin: 3.9 g/dL (ref 3.5–5.0)
Alkaline Phosphatase: 58 U/L (ref 38–126)
Anion gap: 13 (ref 5–15)
BILIRUBIN TOTAL: 0.3 mg/dL (ref 0.3–1.2)
BILIRUBIN TOTAL: 0.6 mg/dL (ref 0.3–1.2)
BUN: 8 mg/dL (ref 6–20)
BUN: 9 mg/dL (ref 6–20)
CALCIUM: 7.7 mg/dL — AB (ref 8.9–10.3)
CO2: 25 mmol/L (ref 22–32)
CO2: 26 mmol/L (ref 22–32)
CREATININE: 0.87 mg/dL (ref 0.61–1.24)
CREATININE: 1.19 mg/dL (ref 0.61–1.24)
Calcium: 8.8 mg/dL — ABNORMAL LOW (ref 8.9–10.3)
Chloride: 105 mmol/L (ref 101–111)
Chloride: 108 mmol/L (ref 101–111)
GFR calc Af Amer: >60 mL/min (ref >60)
GFR calc Af Amer: >60 mL/min (ref >60)
GFR calc non Af Amer: >60 mL/min (ref >60)
GLUCOSE: 81 mg/dL (ref 65–99)
Glucose, Bld: 82 mg/dL (ref 65–99)
Potassium: 3.6 mmol/L (ref 3.5–5.1)
Potassium: 3.9 mmol/L (ref 3.5–5.1)
SODIUM: 140 mmol/L (ref 135–145)
Sodium: 143 mmol/L (ref 135–145)
TOTAL PROTEIN: 5.2 g/dL — AB (ref 6.5–8.1)
TOTAL PROTEIN: 6.4 g/dL — AB (ref 6.5–8.1)

## 2015-08-05 LAB — URINALYSIS, ROUTINE W REFLEX MICROSCOPIC
Bilirubin Urine: NEGATIVE
Hgb urine dipstick: NEGATIVE
KETONES UR: NEGATIVE mg/dL
Leukocytes, UA: NEGATIVE
Nitrite: NEGATIVE
PROTEIN: 30 mg/dL — AB
Specific Gravity, Urine: 1.022 (ref 1.005–1.030)
pH: 5.5 (ref 5.0–8.0)

## 2015-08-05 LAB — RAPID URINE DRUG SCREEN, HOSP PERFORMED
AMPHETAMINES: NOT DETECTED
Barbiturates: NOT DETECTED
Benzodiazepines: NOT DETECTED
Cocaine: NOT DETECTED
Opiates: POSITIVE — AB
Tetrahydrocannabinol: POSITIVE — AB

## 2015-08-05 LAB — URINE MICROSCOPIC-ADD ON

## 2015-08-05 LAB — MAGNESIUM: MAGNESIUM: 2.3 mg/dL (ref 1.7–2.4)

## 2015-08-05 LAB — I-STAT CG4 LACTIC ACID, ED: LACTIC ACID, VENOUS: 1.23 mmol/L (ref 0.5–2.0)

## 2015-08-05 LAB — TROPONIN I: Troponin I: 0.12 ng/mL — ABNORMAL HIGH (ref <0.031)

## 2015-08-05 MED ORDER — ACETAMINOPHEN 325 MG PO TABS: 650.0000 mg | ORAL_TABLET | Freq: Four times a day (QID) | ORAL | Status: DC | PRN

## 2015-08-05 MED ORDER — VANCOMYCIN HCL IN DEXTROSE 1-5 GM/200ML-% IV SOLN
1000.0000 mg | Freq: Once | INTRAVENOUS | Status: AC
  Administered 2015-08-05: 1000 mg via INTRAVENOUS
  Filled 2015-08-05: qty 200

## 2015-08-05 MED ORDER — SODIUM CHLORIDE 0.9 % IV BOLUS (SEPSIS)
1000.0000 mL | Freq: Once | INTRAVENOUS | Status: AC
  Administered 2015-08-05: 1000 mL via INTRAVENOUS

## 2015-08-05 MED ORDER — ACETAMINOPHEN 650 MG RE SUPP: 650.0000 mg | Freq: Four times a day (QID) | RECTAL | Status: DC | PRN

## 2015-08-05 MED ORDER — SODIUM CHLORIDE 0.9 % IV SOLN
INTRAVENOUS | Status: DC
  Administered 2015-08-05 (×2): via INTRAVENOUS

## 2015-08-05 MED ORDER — ONDANSETRON HCL 4 MG/2ML IJ SOLN: 4.0000 mg | Freq: Four times a day (QID) | INTRAMUSCULAR | Status: DC | PRN

## 2015-08-05 MED ORDER — VANCOMYCIN HCL IN DEXTROSE 1-5 GM/200ML-% IV SOLN
1000.0000 mg | Freq: Three times a day (TID) | INTRAVENOUS | Status: DC
  Administered 2015-08-05: 1000 mg via INTRAVENOUS

## 2015-08-05 MED ORDER — SODIUM CHLORIDE 0.9 % IV SOLN
INTRAVENOUS | Status: DC
  Administered 2015-08-05: 125 mL/h via INTRAVENOUS

## 2015-08-05 MED ORDER — ONDANSETRON HCL 4 MG PO TABS: 4.0000 mg | ORAL_TABLET | Freq: Four times a day (QID) | ORAL | Status: DC | PRN

## 2015-08-05 MED ORDER — PIPERACILLIN-TAZOBACTAM 3.375 G IVPB 30 MIN
3.3750 g | Freq: Once | INTRAVENOUS | Status: AC
  Administered 2015-08-05: 3.375 g via INTRAVENOUS
  Filled 2015-08-05: qty 50

## 2015-08-05 MED ORDER — PIPERACILLIN-TAZOBACTAM 3.375 G IVPB
3.3750 g | Freq: Three times a day (TID) | INTRAVENOUS | Status: DC
  Administered 2015-08-05: 3.375 g via INTRAVENOUS

## 2015-08-05 NOTE — Progress Notes (Signed)
Pharmacy Antibiotic Note  Robert SneddonGregory Phelps is a 45 y.o. male admitted on 08/04/2015 with sepsis.  Pharmacy has been consulted for Vancomycin and Zosyn  dosing.  Plan: Vancomycin 1gm  IV every 8 hours.  Goal trough 15-20 mcg/mL. Zosyn 3.375g IV q8h (4 hour infusion).  Height: 6\' 2"  (188 cm) Weight: 210 lb (95.255 kg) IBW/kg (Calculated) : 82.2  Temp (24hrs), Avg:97.5 F (36.4 C), Min:97.5 F (36.4 C), Max:97.5 F (36.4 C)   Recent Labs Lab 08/04/15 2354 08/05/15 0242  WBC 28.1*  --   CREATININE 1.19  --   LATICACIDVEN  --  1.23    Estimated Creatinine Clearance: 94 mL/min (by C-G formula based on Cr of 1.19).    No Known Allergies  Antimicrobials this admission: Vancomycin 3/3 >>  Zosyn 3/3 >>   Thank you for allowing pharmacy to be a part of this patient's care.  Aleene DavidsonGrimsley Jr, Robert Phelps 08/05/2015 6:30 AM

## 2015-08-05 NOTE — Progress Notes (Signed)
ED Cm noted Cm consult for medication needs Cm reviewed EPIC chart EDP note which indicates pt med list for prilosec and pepcid- pt with office visits in 2015 to Dr Sherene SiresWert Pulmonologist and Crosby Oysteravid Tysinger PA at [facility] on [address] [place]  Refer to Hospitalist note  Cm visited pt who was finishing ultrasound procedure CM discussed CM consult for medication needs but pt states he does not have medication needs at this time He does confirm he "broke up with" Dr Sherene SiresWert and Crosby Oysteravid Tysinger, PA He states "I felt they were about the money and not trying to find out what was wrong with me" CM spoke with pt who [place].   CM discussed and provided written information for uninsured accepting pcps, discussed the importance of pcp vs EDP services for f/u care, www.needymeds.org, www.goodrx.com, discounted pharmacies and other [place] resources such as [facility] , [facility], affordable care act, financial assistance, uninsured dental services, [place] med assist, DSS and  health department  Reviewed resources for [place] uninsured accepting pcps like [facility], [facility] [address], [facility], [facility], local urgent care centers, [facility], [facility], general medical clinics, family services of the [place], [facility] urgent care plus others, medication resources, [facility] out patient pharmacies and housing Pt voiced understanding and appreciation of resources provided  Encouraged use of goodrx- Cm provided him with the cost sheets for prilosec and pepcid from [employer] Pt made aware that prilosec is OTC and pepcid is at [facility] for $4 Placed resources in pt belonging bag  Pt very alert and oriented to person, place, time and situation.    Provided [facility] contact information Pt did not agreed to a referral Cm completed referral at this time

## 2015-08-05 NOTE — ED Notes (Addendum)
PT does not wish to be stuck again. Nurse will try to pull off line

## 2015-08-05 NOTE — Progress Notes (Signed)
[facility] PROGRESS NOTE  Robert SneddonGregory Phelps ZOX:[id number] DOB: 04/21/1971 DOA: 08/04/2015 PCP: No PCP Per Patient  Assessment/Plan: 1. Heroin overdose -Robert Phelps found unresponsive by family members, admitted to using heroin yesterday at 5 PM. -EMS called, per emergency room documentation, was found to have O2 sat of 40% on the field and was given Narcan intranasally. -Urine structure and was positive for opiates and THC -In the emergency department he was found to be hypotensive for which he was given IV fluids. -CT scan of brain showing no acute changes.  2.  Hypotension. -Patient found to have systolic blood pressures in the 80s to 90s, could be related to drug overdose. -Plan to continue IV fluid resuscitation -He is otherwise mentating well, nontoxic appearing no acute distress during my evaluation.  3.  Leukocytosis. -Lab work showed elevated white count of 28,001 -He reported having some cough lately. -We'll check a flu swab.  Code Status: Full code Family Communication: Spoke to his wife is present inside Disposition Plan: overnight obs    HPI/Subjective: Robert Phelps is a 45 year old without a significant past medical history, found unresponsive by his wife at home. He admitted to using heroin yesterday around 5 PM. He also had reported drinking  a 40 ounce beer. EMS called by family members was given Narcan on the field with subsequent improvement to mental status changes. In the emergency department and was found to be hypotensive for which he was given IV fluid resuscitation. Labs showing elevated white count of 28,000.    Objective: Filed Vitals:   08/05/15 1330 08/05/15 1447  BP: 103/72 106/74  Pulse: 50 67  Temp:    Resp: 9 10    Intake/Output Summary (Last 24 hours) at 08/05/15 1452 Last data filed at 08/05/15 [id number]  Gross per 24 hour  Intake   2000 ml  Output      0 ml  Net   2000 ml   Filed Weights   08/05/15 [id number]  Weight: 95.255 kg (210 lb)     Exam:   General:  Robert Phelps is awake and alert, no acute distress, mentating well  Cardiovascular: regular rate and rhythm normal S1-S2 no murmurs or gallops  Respiratory: normal respiratory effort  Abdomen: soft nontender nondistended  Musculoskeletal: no edema  Data Reviewed: Basic Metabolic Panel:  Recent Labs Lab 08/04/15 2354  NA 143  K 3.6  CL 105  CO2 25  GLUCOSE 82  BUN 9  CREATININE 1.19  CALCIUM 8.8*  MG 2.3   Liver Function Tests:  Recent Labs Lab 08/04/15 2354  AST 62*  ALT 57  ALKPHOS 58  BILITOT 0.6  PROT 6.4*  ALBUMIN 3.9   No results for input(s): LIPASE, AMYLASE in the last 168 hours. No results for input(s): AMMONIA in the last 168 hours. CBC:  Recent Labs Lab 08/04/15 2354  WBC 28.1*  NEUTROABS 23.4*  HGB 15.0  HCT 44.7  MCV 94.3  PLT 270   Cardiac Enzymes: No results for input(s): CKTOTAL, CKMB, CKMBINDEX, TROPONINI in the last 168 hours. BNP (last 3 results) No results for input(s): BNP in the last 8760 hours.  ProBNP (last 3 results) No results for input(s): PROBNP in the last 8760 hours.  CBG: No results for input(s): GLUCAP in the last 168 hours.  No results found for this or any previous visit (from the past 240 hour(s)).   Studies: Dg Chest 2 View  08/05/2015  CLINICAL DATA:  Acute onset of lower extremity pain  and cramping. Headache. Initial encounter. EXAM: CHEST  2 VIEW COMPARISON:  Chest radiograph performed 12/09/2014 FINDINGS: The lungs are well-aerated and clear. There is no evidence of focal opacification, pleural effusion or pneumothorax. The heart is normal in size; the mediastinal contour is within normal limits. No acute osseous abnormalities are seen. IMPRESSION: No acute cardiopulmonary process seen. Electronically Signed   By: Roanna Raider M.D.   On: 08/05/2015 02:33   Ct Head Wo Contrast  08/05/2015  CLINICAL DATA:  Heroin overdose, found unresponsive. EXAM: CT HEAD WITHOUT CONTRAST TECHNIQUE:  Contiguous axial images were obtained from the base of the skull through the vertex without intravenous contrast. COMPARISON:  None. FINDINGS: The ventricles and sulci are normal. No intraparenchymal hemorrhage, mass effect nor midline shift. No acute large vascular territory infarcts. No abnormal extra-axial fluid collections. Basal cisterns are patent. No skull fracture. The included ocular globes and orbital contents are non-suspicious. The mastoid aircells and included paranasal sinuses are well-aerated. IMPRESSION: Negative CT head. Electronically Signed   By: Awilda Metro M.D.   On: 08/05/2015 05:53    Scheduled Meds:  Continuous Infusions: . sodium chloride 150 mL/hr at 08/05/15 1307  . piperacillin-tazobactam (ZOSYN)  IV    . vancomycin      Principal Problem:   Syncope Active Problems:   Hypotension   Polysubstance abuse    Time spent:     Jeralyn Bennett  [facility] Pager [phone]. If 7PM-7AM, please contact night-coverage at [email or web address], password [facility] 08/05/2015, 2:52 PM

## 2015-08-05 NOTE — Progress Notes (Signed)
Echocardiogram 2D Echocardiogram has been performed.  Robert Phelps, Tony 08/05/2015, 3:50 PM

## 2015-08-05 NOTE — Progress Notes (Signed)
Patient demanded to leave AMA and requested a doctor's note for work the night he was here. He removed his IV's and tele box. He seemed very agitated and irritable. He refused to be educated on the risks of leaving AMA. The doctor, charge nurse and [facility] was informed.

## 2015-08-05 NOTE — H&P (Signed)
[facility] History and Physical  Robert Phelps WUJ:[id number] DOB: Jan 03, 1971 DOA: 08/04/2015  Referring physician: Dr. Elesa Massed. PCP: Ernst Breach, PA-C  Specialists: None.  Chief Complaint: Loss of consciousness.  HPI: Robert Phelps is a 45 y.o. male with no significant past medical history was brought to the ER after patient's wife found the patient was unresponsive at his home. Patient agrees to take Heroin last evening around 5 PM. EMS was called and patient was given Narcan following which patient's mental status has gradually improved. Patient continues to remain hypotensive in the ER. Patient is receiving his third liter bolus at this time while I was examining but patient appears nonfocal denies any chest pain or shortness of breath. Patient has significant leukocytosis at around 28,000 WBC count but is afebrile. Patient does not have any dysuria or discharges or diarrhea. Patient will be admitted for further management of his syncope and hypotension. EKG shows normal sinus rhythm with QTC of 457 ms.  Review of Systems: As presented in the history of presenting illness, rest negative.  History reviewed. No pertinent past medical history. Past Surgical History  Procedure Laterality Date  . Inguinal hernia repair      right, as child   Social History:  reports that he has been smoking Cigarettes.  He has a 34.5 pack-year smoking history. He does not have any smokeless tobacco history on file. He reports that he drinks about 4.2 oz of alcohol per week. He reports that he uses illicit drugs (Marijuana) about 7 times per week. Where does patient live home. Can patient participate in ADLs? .  No Known Allergies  Family History:  Family History  Problem Relation Age of Onset  . Other Father     AIDS?  . Drug abuse Sister     died of overdose      Prior to Admission medications   Medication Sig Start Date End Date Taking? Authorizing Provider  famotidine (PEPCID) 20  MG tablet One at bedtime Patient not taking: Reported on 08/04/2015 01/14/15   Nyoka Cowden, MD  omeprazole (PRILOSEC OTC) 20 MG tablet Take 1 tablet (20 mg total) by mouth daily. Patient not taking: Reported on 08/04/2015 01/14/15   Nyoka Cowden, MD    Physical Exam: Filed Vitals:   08/05/15 0030 08/05/15 0148 08/05/15 0246 08/05/15 0416  BP: 89/60  Pulse: 79 72 69 60  Temp:      TempSrc:      Resp: SpO2: 95% 92% 96% 95%     General:  Moderately built and nourished.  Eyes: Anicteric no pallor.  ENT: No discharge from the ears eyes nose and mouth.  Neck: No mass felt.  Cardiovascular: S1-S2 heard.  Respiratory: No rhonchi or crepitations.  Abdomen: Soft nontender bowel sounds present.  Skin: No rash.  Musculoskeletal: No edema.  Psychiatric: Appears normal.  Neurologic: Alert awake oriented to time place and person. Moves all extremities.  Labs on Admission:  Basic Metabolic Panel:  Recent Labs Lab 08/04/15 2354  NA 143  K 3.6  CL 105  CO2 25  GLUCOSE 82  BUN 9  CREATININE 1.19  CALCIUM 8.8*  MG 2.3   Liver Function Tests:  Recent Labs Lab 08/04/15 2354  AST 62*  ALT 57  ALKPHOS 58  BILITOT 0.6  PROT 6.4*  ALBUMIN 3.9   No results for input(s): LIPASE, AMYLASE in the last 168 hours. No results for input(s): AMMONIA in the  last 168 hours. CBC:  Recent Labs Lab 08/04/15 2354  WBC 28.1*  NEUTROABS 23.4*  HGB 15.0  HCT 44.7  MCV 94.3  PLT 270   Cardiac Enzymes: No results for input(s): CKTOTAL, CKMB, CKMBINDEX, TROPONINI in the last 168 hours.  BNP (last 3 results) No results for input(s): BNP in the last 8760 hours.  ProBNP (last 3 results) No results for input(s): PROBNP in the last 8760 hours.  CBG: No results for input(s): GLUCAP in the last 168 hours.  Radiological Exams on Admission: Dg Chest 2 View  08/05/2015  CLINICAL DATA:  Acute onset of lower extremity pain and cramping. Headache.  Initial encounter. EXAM: CHEST  2 VIEW COMPARISON:  Chest radiograph performed 12/09/2014 FINDINGS: The lungs are well-aerated and clear. There is no evidence of focal opacification, pleural effusion or pneumothorax. The heart is normal in size; the mediastinal contour is within normal limits. No acute osseous abnormalities are seen. IMPRESSION: No acute cardiopulmonary process seen. Electronically Signed   By: Roanna RaiderJeffery  Chang M.D.   On: 08/05/2015 02:33    EKG: Independently reviewed. Normal sinus rhythm with QTC of 457 ms.  Assessment/Plan Principal Problem:   Syncope Active Problems:   Hypotension   Polysubstance abuse   1. Syncope - most likely related to Heroin intake. Closely monitor in telemetry for any arrhythmias. Check 2-D echo. 2. Hypotension - probably related to drug intake. Closely monitor continue with hydration. Patient does have leukocytosis but afebrile. For now patient was placed on vancomycin and Zosyn which will be continued until blood culture results are available. Monitor in telemetry. 3. Polysubstance abuse - including Heroin and alcohol and tobacco. Patient advised to quit all these habits. Patient is on CIWA protocol. 4. Leukocytosis probably reactionary - follow blood cultures for now patient is on empiric antibiotics.   DVT Prophylaxis Lovenox.  Code Status: Full code.  Family Communication: Discussed with patient's wife.  Disposition Plan: Admit for observation.    Robert Phelps N. [facility] Pager [phone].  If 7PM-7AM, please contact night-coverage [email or web address] Password [email or web address] 08/05/2015, 5:37 AM

## 2015-08-06 LAB — INFLUENZA PANEL BY PCR (TYPE A & B)
H1N1 flu by pcr: NOT DETECTED
Influenza A By PCR: NEGATIVE
Influenza B By PCR: NEGATIVE

## 2015-08-06 LAB — URINE CULTURE: Culture: NO GROWTH

## 2015-08-07 NOTE — Discharge Summary (Signed)
Physician Discharge Summary  Robert Phelps [id number] DOB: 01-23-1971 DOA: 08/04/2015  PCP: No PCP Per Patient  Admit date: 08/04/2015 Discharge date: 08/05/2015  Time spent: 10 min  Recommendations for Outpatient Follow-up:  1. Robert Phelps left AGAINST MEDICAL ADVICE 2. I was not present at the time he left AMA   Discharge Diagnoses:  Principal Problem:   Heroin overdose Active Problems:   Hypotension   Polysubstance abuse   Discharge Condition:   Diet recommendation:   Filed Weights   08/05/15 0611 08/05/15 1531  Weight: 95.255 kg (210 lb) 95.255 kg (210 lb)    History of present illness:  Robert Phelps is a 45 y.o. male with no significant past medical history was brought to the ER after patient's wife found the patient was unresponsive at his home. Patient agrees to take Heroin last evening around 5 PM. EMS was called and patient was given Narcan following which patient's mental status has gradually improved. Patient continues to remain hypotensive in the ER. Patient is receiving his third liter bolus at this time while I was examining but patient appears nonfocal denies any chest pain or shortness of breath. Patient has significant leukocytosis at around 28,000 WBC count but is afebrile. Patient does not have any dysuria or discharges or diarrhea. Patient will be admitted for further management of his syncope and hypotension. EKG shows normal sinus rhythm with QTC of 457 ms.  Hospital Course:  Mr Robert Phelps is a 45 year old without a significant past medical history, found unresponsive by his wife at home. He admitted to using heroin yesterday around 5 PM. He also had reported drinking a 40 ounce beer. EMS called by family members was given Narcan on the field with subsequent improvement to mental status changes. In the emergency department and was found to be hypotensive for which he was given IV fluid resuscitation. Labs showing elevated white count of 28,000. He was found to be  hypotensive in the emergency department for which she was given IV fluids. CT scan of brain showed no acute changes. Unfortunately Robert Phelps left AGAINST MEDICAL ADVICE on the evening of 08/05/2015. I was not present at the time he left AGAINST MEDICAL ADVICE.   Discharge Exam: Filed Vitals:   08/05/15 1510 08/05/15 1531  BP: 100/74 110/79  Pulse:  43  Temp:  98.4 F (36.9 C)  Resp:  16    Discharge Instructions    Discharge Medication List as of 08/05/2015  8:08 PM    CONTINUE these medications which have NOT CHANGED   Details  famotidine (PEPCID) 20 MG tablet One at bedtime, OTC    omeprazole (PRILOSEC OTC) 20 MG tablet Take 1 tablet (20 mg total) by mouth daily., Starting 01/14/2015, Until Discontinued, OTC       No Known Allergies    The results of significant diagnostics from this hospitalization (including imaging, microbiology, ancillary and laboratory) are listed below for reference.    Significant Diagnostic Studies: Dg Chest 2 View  08/05/2015  CLINICAL DATA:  Acute onset of lower extremity pain and cramping. Headache. Initial encounter. EXAM: CHEST  2 VIEW COMPARISON:  Chest radiograph performed 12/09/2014 FINDINGS: The lungs are well-aerated and clear. There is no evidence of focal opacification, pleural effusion or pneumothorax. The heart is normal in size; the mediastinal contour is within normal limits. No acute osseous abnormalities are seen. IMPRESSION: No acute cardiopulmonary process seen. Electronically Signed   By: Roanna RaiderJeffery  Chang M.D.   On: 08/05/2015 02:33   Ct  Head Wo Contrast  08/05/2015  CLINICAL DATA:  Heroin overdose, found unresponsive. EXAM: CT HEAD WITHOUT CONTRAST TECHNIQUE: Contiguous axial images were obtained from the base of the skull through the vertex without intravenous contrast. COMPARISON:  None. FINDINGS: The ventricles and sulci are normal. No intraparenchymal hemorrhage, mass effect nor midline shift. No acute large vascular territory  infarcts. No abnormal extra-axial fluid collections. Basal cisterns are patent. No skull fracture. The included ocular globes and orbital contents are non-suspicious. The mastoid aircells and included paranasal sinuses are well-aerated. IMPRESSION: Negative CT head. Electronically Signed   By: Awilda Metro M.D.   On: 08/05/2015 05:53    Microbiology: Recent Results (from the past 240 hour(s))  Urine culture     Status: None   Collection Time: 08/05/15  1:45 AM  Result Value Ref Range Status   Specimen Description URINE, CLEAN CATCH  Final   Special Requests NONE  Final   Culture   Final    NO GROWTH 1 DAY Performed at [facility]    Report Status 08/06/2015 FINAL  Final  Blood culture (routine x 2)     Status: None (Preliminary result)   Collection Time: 08/05/15  2:36 AM  Result Value Ref Range Status   Specimen Description BLOOD RIGHT ANTECUBITAL  Final   Special Requests BOTTLES DRAWN AEROBIC AND ANAEROBIC 5CC  Final   Culture   Final    NO GROWTH 2 DAYS Performed at [facility]    Report Status PENDING  Incomplete  Blood culture (routine x 2)     Status: None (Preliminary result)   Collection Time: 08/05/15  2:37 AM  Result Value Ref Range Status   Specimen Description BLOOD LEFT HAND  Final   Special Requests BOTTLES DRAWN AEROBIC AND ANAEROBIC 5CC  Final   Culture   Final    NO GROWTH 2 DAYS Performed at [facility]    Report Status PENDING  Incomplete     Labs: Basic Metabolic Panel:  Recent Labs Lab 08/04/15 2354 08/05/15 1545  NA 143 140  K 3.6 3.9  CL 105 108  CO2 25 26  GLUCOSE 82 81  BUN 9 8  CREATININE 1.19 0.87  CALCIUM 8.8* 7.7*  MG 2.3  --    Liver Function Tests:  Recent Labs Lab 08/04/15 2354 08/05/15 1545  AST 62* 40  ALT 57 42  ALKPHOS 58 50  BILITOT 0.6 0.3  PROT 6.4* 5.2*  ALBUMIN 3.9 3.1*   No results for input(s): LIPASE, AMYLASE in the last 168 hours. No results for input(s): AMMONIA in the last  168 hours. CBC:  Recent Labs Lab 08/04/15 2354 08/05/15 1545  WBC 28.1* 13.9*  NEUTROABS 23.4* 8.3*  HGB 15.0 12.4*  HCT 44.7 37.6*  MCV 94.3 94.0  PLT 270 222   Cardiac Enzymes:  Recent Labs Lab 08/05/15 1545  TROPONINI 0.12*   BNP: BNP (last 3 results) No results for input(s): BNP in the last 8760 hours.  ProBNP (last 3 results) No results for input(s): PROBNP in the last 8760 hours.  CBG: No results for input(s): GLUCAP in the last 168 hours.     Signed:  Jeralyn Bennett MD.  [facility] 08/07/2015, 3:40 PM

## 2015-08-10 LAB — CULTURE, BLOOD (ROUTINE X 2)
CULTURE: NO GROWTH
Culture: NO GROWTH

## 2015-08-16 ENCOUNTER — Ambulatory Visit: Payer: Self-pay | Attending: Internal Medicine | Admitting: Internal Medicine

## 2015-08-16 ENCOUNTER — Encounter: Payer: Self-pay | Admitting: Internal Medicine

## 2015-08-16 VITALS — BP 123/85 | HR 84 | Temp 98.1°F | Resp 16 | Ht 74.0 in | Wt 213.0 lb

## 2015-08-16 DIAGNOSIS — F17210 Nicotine dependence, cigarettes, uncomplicated: Secondary | ICD-10-CM | POA: Insufficient documentation

## 2015-08-16 DIAGNOSIS — F1910 Other psychoactive substance abuse, uncomplicated: Secondary | ICD-10-CM

## 2015-08-16 DIAGNOSIS — R197 Diarrhea, unspecified: Secondary | ICD-10-CM | POA: Insufficient documentation

## 2015-08-16 DIAGNOSIS — T401X1A Poisoning by heroin, accidental (unintentional), initial encounter: Secondary | ICD-10-CM | POA: Insufficient documentation

## 2015-08-16 DIAGNOSIS — Z720 Tobacco use: Secondary | ICD-10-CM

## 2015-08-16 DIAGNOSIS — Z09 Encounter for follow-up examination after completed treatment for conditions other than malignant neoplasm: Secondary | ICD-10-CM | POA: Insufficient documentation

## 2015-08-16 DIAGNOSIS — Z79899 Other long term (current) drug therapy: Secondary | ICD-10-CM | POA: Insufficient documentation

## 2015-08-16 MED ORDER — SACCHAROMYCES BOULARDII 250 MG PO CAPS: 250.0000 mg | ORAL_CAPSULE | Freq: Two times a day (BID) | ORAL | Status: DC

## 2015-08-16 MED ORDER — NICOTINE 21 MG/24HR TD PT24: 21.0000 mg | MEDICATED_PATCH | Freq: Every day | TRANSDERMAL | Status: DC

## 2015-08-16 NOTE — Progress Notes (Signed)
Patient's here for hospital f/up drug overdose. Patient states he feels good today.   Patient reports his heart isn't pumping fluid like it should.  Patient also reports that ED Physician stated his white blood was low indicating an infxn.   Patient here to est.care with PCP.

## 2015-08-16 NOTE — Progress Notes (Signed)
Robert Phelps, is a 45 y.o. male  [id number]  [id number]  DOB - 1971-01-30  CC:  Chief Complaint  Patient presents with  . Hospitalization Follow-up  . Drug Overdose       HPI: Robert Phelps is a 45 y.o. male here today to establish medical care.  Recent hospital dc 3/2 - 08/05/15 for heroin OD, for which wife found unresponsive.  Responded to narcan.  States he may have gotten some bad heroin.  Has since stayed away heroin.  Attest to drinking 1 pint of brandy, that lasted all weekend. Does marrijuana daily.  1ppd daily. Interested in cleaning himself up, this OD event scared him. But doesn't think he needs rehab.  He has been in it in the past.   Patient has No headache, No chest pain, No abdominal pain - No Nausea, No new weakness tingling or numbness, No Cough - SOB.  +co interrmittant diarrhea, more soft in nature than loose now. Occurred prior to hospitalizaiton, and about same now.  Interested in smoking cessation.  No Known Allergies History reviewed. No pertinent past medical history. Current Outpatient Prescriptions on File Prior to Visit  Medication Sig Dispense Refill  . famotidine (PEPCID) 20 MG tablet One at bedtime (Patient not taking: Reported on 08/04/2015)    . omeprazole (PRILOSEC OTC) 20 MG tablet Take 1 tablet (20 mg total) by mouth daily. (Patient not taking: Reported on 08/04/2015)     No current facility-administered medications on file prior to visit.   Family History  Problem Relation Age of Onset  . Other Father     AIDS?  . Drug abuse Sister     died of overdose   Social History   Social History  . Marital Status: Single    Spouse Name: N/A  . Number of Children: N/A  . Years of Education: N/A   Occupational History  . steel fabricator    Social History Main Topics  . Smoking status: Current Every Day Smoker -- 1.50 packs/day for 23 years    Types: Cigarettes  . Smokeless tobacco: Not on file  . Alcohol Use: 4.2 oz/week    7 Standard  drinks or equivalent per week  . Drug Use: 7.00 per week    Special: Marijuana  . Sexual Activity: Not on file   Other Topics Concern  . Not on file   Social History Narrative   Married, has daughter, drinks 1/5th of liquor every 2 days, Christian, Psychologist, prison and probation services.      Review of Systems: Constitutional: Negative for fever, chills, diaphoresis, activity change, appetite change and fatigue. HENT: Negative for ear pain, nosebleeds, congestion, facial swelling, rhinorrhea, neck pain, neck stiffness and ear discharge.  Eyes: Negative for pain, discharge, redness, itching and visual disturbance. Respiratory: Negative for cough, choking, chest tightness, shortness of breath, wheezing and stridor.  Cardiovascular: Negative for chest pain, palpitations and leg swelling. Gastrointestinal: Negative for abdominal distention.  +loose stools at times Genitourinary: Negative for dysuria, urgency, frequency, hematuria, flank pain, decreased urine volume, difficulty urinating and dyspareunia.  Musculoskeletal: Negative for back pain, joint swelling, arthralgia and gait problem. Neurological: Negative for dizziness, tremors, seizures, syncope, facial asymmetry, speech difficulty, weakness, light-headedness, numbness and headaches.  Hematological: Negative for adenopathy. Does not bruise/bleed easily. Psychiatric/Behavioral: Negative for hallucinations, behavioral problems, confusion, dysphoric mood, decreased concentration and agitation.    Objective:   Filed Vitals:   08/16/15 1629  BP: 123/85  Pulse: 84  Temp: 98.1 F (36.7 C)  Resp: 16  Physical Exam: Constitutional: Patient appears well-developed and well-nourished. No distress.  aaox 3, pleasant HENT: Normocephalic, atraumatic, External right and left ear normal. Oropharynx is clear and moist.  Eyes: Conjunctivae and EOM are normal. PERRL, no scleral icterus. Neck: Normal ROM. Neck supple. No JVD. No tracheal deviation. No  thyromegaly. CVS: RRR, S1/S2 +, no murmurs, no gallops, no carotid bruit.  Pulmonary: Effort and breath sounds normal, no stridor, rhonchi, wheezes, rales.  Abdominal: Soft. BS +, no distension, tenderness, rebound or guarding.  Musculoskeletal: Normal range of motion. No edema and no tenderness.  Lymphadenopathy: No lymphadenopathy noted, cervical, inguinal or axillary Neuro: Alert. Normal reflexes, muscle tone coordination. No cranial nerve deficit. Skin: Skin is warm and dry. No rash noted. Not diaphoretic. No erythema. No pallor. Psychiatric: Normal mood and affect. Behavior, judgment, thought content normal.  Lab Results  Component Value Date   WBC 13.9* 08/05/2015   HGB 12.4* 08/05/2015   HCT 37.6* 08/05/2015   MCV 94.0 08/05/2015   PLT 222 08/05/2015   Lab Results  Component Value Date   CREATININE 0.87 08/05/2015   BUN 8 08/05/2015   NA 140 08/05/2015   K 3.9 08/05/2015   CL 108 08/05/2015   CO2 26 08/05/2015    No results found for: HGBA1C Lipid Panel  No results found for: CHOL, TRIG, HDL, CHOLHDL, VLDL, LDLCALC     Assessment and plan:   1. Polysubstance abuse, etoh, heroin, marrijuana - recommended to stop all, encouraged cessation.  2. Cigarette smoker, 1ppd, intersted  - trial nicoderm patch 21mg /day  3. Diarrhea, unspecified type, loose at times, sometimes more formed, likley 2nd to flora/recent admission/etoh/drugs/marijuana - trial probiotics.    Return in about 3 months (around 11/16/2015).  The patient was given clear instructions to go to ER or return to medical center if symptoms don't improve, worsen or new problems develop. The patient verbalized understanding. The patient was told to call to get lab results if they haven't heard anything in the next week.    Pete Glatterawn T Stori Royse, MD, MBA/MHA [facility] [place], [place] [phone]   08/16/2015, 5:24 PM

## 2015-08-16 NOTE — Patient Instructions (Signed)
- smoking cessation discussed, recommended! - financial services  Smoking Cessation, Tips for Success If you are ready to quit smoking, congratulations! You have chosen to help yourself be healthier. Cigarettes bring nicotine, tar, carbon monoxide, and other irritants into your body. Your lungs, heart, and blood vessels will be able to work better without these poisons. There are many different ways to quit smoking. Nicotine gum, nicotine patches, a nicotine inhaler, or nicotine nasal spray can help with physical craving. Hypnosis, support groups, and medicines help break the habit of smoking. WHAT THINGS CAN I DO TO MAKE QUITTING EASIER?  Here are some tips to help you quit for good:  Pick a date when you will quit smoking completely. Tell all of your friends and family about your plan to quit on that date.  Do not try to slowly cut down on the number of cigarettes you are smoking. Pick a quit date and quit smoking completely starting on that day.  Throw away all cigarettes.   Clean and remove all ashtrays from your home, work, and car.  On a card, write down your reasons for quitting. Carry the card with you and read it when you get the urge to smoke.  Cleanse your body of nicotine. Drink enough water and fluids to keep your urine clear or pale yellow. Do this after quitting to flush the nicotine from your body.  Learn to predict your moods. Do not let a bad situation be your excuse to have a cigarette. Some situations in your life might tempt you into wanting a cigarette.  Never have "just one" cigarette. It leads to wanting another and another. Remind yourself of your decision to quit.  Change habits associated with smoking. If you smoked while driving or when feeling stressed, try other activities to replace smoking. Stand up when drinking your coffee. Brush your teeth after eating. Sit in a different chair when you read the paper. Avoid alcohol while trying to quit, and try to drink  fewer caffeinated beverages. Alcohol and caffeine may urge you to smoke.  Avoid foods and drinks that can trigger a desire to smoke, such as sugary or spicy foods and alcohol.  Ask people who smoke not to smoke around you.  Have something planned to do right after eating or having a cup of coffee. For example, plan to take a walk or exercise.  Try a relaxation exercise to calm you down and decrease your stress. Remember, you may be tense and nervous for the first 2 weeks after you quit, but this will pass.  Find new activities to keep your hands busy. Play with a pen, coin, or rubber band. Doodle or draw things on paper.  Brush your teeth right after eating. This will help cut down on the craving for the taste of tobacco after meals. You can also try mouthwash.   Use oral substitutes in place of cigarettes. Try using lemon drops, carrots, cinnamon sticks, or chewing gum. Keep them handy so they are available when you have the urge to smoke.  When you have the urge to smoke, try deep breathing.  Designate your home as a nonsmoking area.  If you are a heavy smoker, ask your health care provider about a prescription for nicotine chewing gum. It can ease your withdrawal from nicotine.  Reward yourself. Set aside the cigarette money you save and buy yourself something nice.  Look for support from others. Join a support group or smoking cessation program. Ask someone at home  or at work to help you with your plan to quit smoking.  Always ask yourself, "Do I need this cigarette or is this just a reflex?" Tell yourself, "Today, I choose not to smoke," or "I do not want to smoke." You are reminding yourself of your decision to quit.  Do not replace cigarette smoking with electronic cigarettes (commonly called e-cigarettes). The safety of e-cigarettes is unknown, and some may contain harmful chemicals.  If you relapse, do not give up! Plan ahead and think about what you will do the next time you  get the urge to smoke. HOW WILL I FEEL WHEN I QUIT SMOKING? You may have symptoms of withdrawal because your body is used to nicotine (the addictive substance in cigarettes). You may crave cigarettes, be irritable, feel very hungry, cough often, get headaches, or have difficulty concentrating. The withdrawal symptoms are only temporary. They are strongest when you first quit but will go away within 10-14 days. When withdrawal symptoms occur, stay in control. Think about your reasons for quitting. Remind yourself that these are signs that your body is healing and getting used to being without cigarettes. Remember that withdrawal symptoms are easier to treat than the major diseases that smoking can cause.  Even after the withdrawal is over, expect periodic urges to smoke. However, these cravings are generally short lived and will go away whether you smoke or not. Do not smoke! WHAT RESOURCES ARE AVAILABLE TO HELP ME QUIT SMOKING? Your health care provider can direct you to community resources or hospitals for support, which may include:  Group support.  Education.  Hypnosis.  Therapy.   This information is not intended to replace advice given to you by your health care provider. Make sure you discuss any questions you have with your health care provider.   Document Released: 02/17/2004 Document Revised: 06/11/2014 Document Reviewed: 11/06/2012 Elsevier Interactive Patient Education [employer]2016 [employer]

## 2016-02-03 ENCOUNTER — Emergency Department (HOSPITAL_COMMUNITY)
Admission: EM | Admit: 2016-02-03 | Discharge: 2016-02-03 | Disposition: A | Discharge status: Home / Self Care | Payer: Self-pay | Attending: Emergency Medicine | Admitting: Emergency Medicine

## 2016-02-03 ENCOUNTER — Encounter (HOSPITAL_COMMUNITY): Payer: Self-pay | Admitting: *Deleted

## 2016-02-03 DIAGNOSIS — F17210 Nicotine dependence, cigarettes, uncomplicated: Secondary | ICD-10-CM | POA: Insufficient documentation

## 2016-02-03 DIAGNOSIS — M545 Low back pain, unspecified: Secondary | ICD-10-CM

## 2016-02-03 MED ORDER — HYDROCODONE-ACETAMINOPHEN 5-325 MG PO TABS: 1.0000 | ORAL_TABLET | ORAL | 0 refills | Status: DC | PRN

## 2016-02-03 MED ORDER — METHOCARBAMOL 500 MG PO TABS: 500.0000 mg | ORAL_TABLET | Freq: Three times a day (TID) | ORAL | 0 refills | Status: DC | PRN

## 2016-02-03 MED ORDER — NAPROXEN 500 MG PO TABS: 500.0000 mg | ORAL_TABLET | Freq: Two times a day (BID) | ORAL | 0 refills | Status: DC

## 2016-02-03 MED ORDER — HYDROCODONE-ACETAMINOPHEN 5-325 MG PO TABS
1.0000 | ORAL_TABLET | Freq: Once | ORAL | Status: AC
  Administered 2016-02-03: 1 via ORAL
  Filled 2016-02-03: qty 1

## 2016-02-03 MED ORDER — IBUPROFEN 800 MG PO TABS
800.0000 mg | ORAL_TABLET | Freq: Once | ORAL | Status: AC
  Administered 2016-02-03: 800 mg via ORAL
  Filled 2016-02-03: qty 1

## 2016-02-03 MED ORDER — METHOCARBAMOL 500 MG PO TABS
1000.0000 mg | ORAL_TABLET | Freq: Once | ORAL | Status: AC
  Administered 2016-02-03: 1000 mg via ORAL
  Filled 2016-02-03: qty 2

## 2016-02-03 NOTE — ED Provider Notes (Signed)
[facility]EMERG[facility] DEPT Provider Note   CSN: [id number] Arrival date & time: 02/03/16  1204     History   Chief Complaint Chief Complaint  Patient presents with  . Back Pain  . Chest Pain    HPI Robert Phelps is a 45 y.o. male.  He has no history of chronic back problems or previous history of back injuries. He states he was at work today. He was simply walking around. He states he turned to go do something differently than he had been doing. Started having some pain left side of his back. He has had spasms and in since that time. He presents here. Pain is well localized left lower back. No pain to his upper or lower extremity. No pain into his buttock. He does not have an abnormal gait. No weakness no bowel or bladder symptoms.  HPI  History reviewed. No pertinent past medical history.  Patient Active Problem List   Diagnosis Date Noted  . Hypotension 08/05/2015  . Syncope 08/05/2015  . Polysubstance abuse 08/05/2015  . Arterial hypotension   . Heroin overdose   . Cough 01/15/2015  . Cigarette smoker 01/15/2015  . Hyperprolactinemia (HCC) 01/14/2015    Past Surgical History:  Procedure Laterality Date  . INGUINAL HERNIA REPAIR     right, as child       Home Medications    Prior to Admission medications   Medication Sig Start Date End Date Taking? Authorizing Provider  famotidine (PEPCID) 20 MG tablet One at bedtime Patient not taking: Reported on 08/04/2015 01/14/15   Nyoka CowdenMichael B Wert, MD  HYDROcodone-acetaminophen (NORCO/VICODIN) 5-325 MG tablet Take 1 tablet by mouth every 4 (four) hours as needed. 02/03/16   Rolland PorterMark Tylah Mancillas, MD  methocarbamol (ROBAXIN) 500 MG tablet Take 1 tablet (500 mg total) by mouth 3 (three) times daily between meals as needed. 02/03/16   Rolland PorterMark Montez Cuda, MD  naproxen (NAPROSYN) 500 MG tablet Take 1 tablet (500 mg total) by mouth 2 (two) times daily. 02/03/16   Rolland PorterMark Trevel Dillenbeck, MD  nicotine (NICODERM CQ) 21 mg/24hr patch Place 1 patch (21 mg total) onto the skin  daily. Patient not taking: Reported on 02/03/2016 08/16/15   Pete Glatterawn T Langeland, MD  omeprazole (PRILOSEC OTC) 20 MG tablet Take 1 tablet (20 mg total) by mouth daily. Patient not taking: Reported on 08/04/2015 01/14/15   Nyoka CowdenMichael B Wert, MD  saccharomyces boulardii (FLORASTOR) 250 MG capsule Take 1 capsule (250 mg total) by mouth 2 (two) times daily. Patient not taking: Reported on 02/03/2016 08/16/15   Pete Glatterawn T Langeland, MD    Family History Family History  Problem Relation Age of Onset  . Other Father     AIDS?  . Drug abuse Sister     died of overdose    Social History Social History  Substance Use Topics  . Smoking status: Current Every Day Smoker    Packs/day: 1.50    Years: 23.00    Types: Cigarettes  . Smokeless tobacco: Never Used  . Alcohol use 4.2 oz/week    7 Standard drinks or equivalent per week     Allergies   Review of patient's allergies indicates no known allergies.   Review of Systems Review of Systems  Constitutional: Negative for appetite change, chills, diaphoresis, fatigue and fever.  HENT: Negative for mouth sores, sore throat and trouble swallowing.   Eyes: Negative for visual disturbance.  Respiratory: Negative for cough, chest tightness, shortness of breath and wheezing.   Cardiovascular: Negative for chest pain.  Gastrointestinal: Negative for abdominal distention, abdominal pain, diarrhea, nausea and vomiting.  Endocrine: Negative for polydipsia, polyphagia and polyuria.  Genitourinary: Negative for dysuria, frequency and hematuria.  Musculoskeletal: Positive for back pain. Negative for gait problem.  Skin: Negative for color change, pallor and rash.  Neurological: Negative for dizziness, syncope, light-headedness and headaches.  Hematological: Does not bruise/bleed easily.  Psychiatric/Behavioral: Negative for behavioral problems and confusion.     Physical Exam Updated Vital Signs BP 122/91 (BP Location: Left Arm)   Pulse 84   Temp 99.1 F  (37.3 C) (Oral)   Resp 13   Ht 6\' 2"  (1.88 m)   Wt 215 lb (97.5 kg)   SpO2 100%   BMI 27.60 kg/m   Physical Exam  Constitutional: He is oriented to person, place, and time. He appears well-developed and well-nourished. No distress.  HENT:  Head: Normocephalic.  Eyes: Conjunctivae are normal. Pupils are equal, round, and reactive to light. No scleral icterus.  Neck: Normal range of motion. Neck supple. No thyromegaly present.  Cardiovascular: Normal rate and regular rhythm.  Exam reveals no gallop and no friction rub.   No murmur heard. Pulmonary/Chest: Effort normal and breath sounds normal. No respiratory distress. He has no wheezes. He has no rales.  Abdominal: Soft. Bowel sounds are normal. He exhibits no distension. There is no tenderness. There is no rebound.  Musculoskeletal: Normal range of motion.       Back:  No radiation of pain to the extremities. Normal neurovascular exam to lower extremities.  Neurological: He is alert and oriented to person, place, and time.  Skin: Skin is warm and dry. No rash noted.  Psychiatric: He has a normal mood and affect. His behavior is normal.     ED Treatments / Results  Labs (all labs ordered are listed, but only abnormal results are displayed) Labs Reviewed - No data to display  EKG  EKG Interpretation None       Radiology No results found.  Procedures Procedures (including critical care time)  Medications Ordered in ED Medications  HYDROcodone-acetaminophen (NORCO/VICODIN) 5-325 MG per tablet 1 tablet (not administered)  ibuprofen (ADVIL,MOTRIN) tablet 800 mg (not administered)  methocarbamol (ROBAXIN) tablet 1,000 mg (not administered)     Initial Impression / Assessment and Plan / ED Course  I have reviewed the triage vital signs and the nursing notes.  Pertinent labs & imaging results that were available during my care of the patient were reviewed by me and considered in my medical decision making (see chart  for details).  Clinical Course    Problem exam. No red flag history or symptoms. Plan is discharge. Conservative treatment. Ice 3 times a day, anti-inflammatory pain medicines most relaxant. Slowly increase activity.  Final Clinical Impressions(s) / ED Diagnoses   Final diagnoses:  Left-sided low back pain without sciatica    New Prescriptions New Prescriptions   HYDROCODONE-ACETAMINOPHEN (NORCO/VICODIN) 5-325 MG TABLET    Take 1 tablet by mouth every 4 (four) hours as needed.   METHOCARBAMOL (ROBAXIN) 500 MG TABLET    Take 1 tablet (500 mg total) by mouth 3 (three) times daily between meals as needed.   NAPROXEN (NAPROSYN) 500 MG TABLET    Take 1 tablet (500 mg total) by mouth 2 (two) times daily.     Rolland Porter, MD 02/03/16 1336

## 2016-02-03 NOTE — ED Notes (Signed)
PT DISCHARGED. INSTRUCTIONS AND PRESCRIPTIONS GIVEN. AAOX4. PT IN NO APPARENT DISTRESS. THE OPPORTUNITY TO ASK QUESTIONS WAS PROVIDED. 

## 2016-02-03 NOTE — Discharge Instructions (Signed)
Rest, supine, next 24 hours.  Apply ice to painful areas three times per day.  Slowly increase your activity.

## 2016-02-03 NOTE — ED Triage Notes (Signed)
Pt complains of back pain since 10AM today, left chest pain x 3 days. 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 Pt states his chest pain is worse with deep breath and cough. Pt states back pain is constant and worse with movement. Pt denies nausea, diaphoresis, shortness of breath.

## 2016-03-08 ENCOUNTER — Emergency Department (HOSPITAL_COMMUNITY)
Admission: EM | Admit: 2016-03-08 | Discharge: 2016-03-08 | Disposition: A | Discharge status: Home / Self Care | Payer: No Typology Code available for payment source | Attending: Emergency Medicine | Admitting: Emergency Medicine

## 2016-03-08 ENCOUNTER — Encounter (HOSPITAL_COMMUNITY): Payer: Self-pay | Admitting: Emergency Medicine

## 2016-03-08 ENCOUNTER — Emergency Department (HOSPITAL_COMMUNITY): Payer: No Typology Code available for payment source

## 2016-03-08 DIAGNOSIS — S20219A Contusion of unspecified front wall of thorax, initial encounter: Secondary | ICD-10-CM | POA: Insufficient documentation

## 2016-03-08 DIAGNOSIS — Y9389 Activity, other specified: Secondary | ICD-10-CM | POA: Diagnosis not present

## 2016-03-08 DIAGNOSIS — Y92410 Unspecified street and highway as the place of occurrence of the external cause: Secondary | ICD-10-CM | POA: Insufficient documentation

## 2016-03-08 DIAGNOSIS — S299XXA Unspecified injury of thorax, initial encounter: Secondary | ICD-10-CM | POA: Diagnosis present

## 2016-03-08 DIAGNOSIS — F17210 Nicotine dependence, cigarettes, uncomplicated: Secondary | ICD-10-CM | POA: Insufficient documentation

## 2016-03-08 DIAGNOSIS — Z79899 Other long term (current) drug therapy: Secondary | ICD-10-CM | POA: Diagnosis not present

## 2016-03-08 DIAGNOSIS — Y999 Unspecified external cause status: Secondary | ICD-10-CM | POA: Insufficient documentation

## 2016-03-08 MED ORDER — IBUPROFEN 600 MG PO TABS: 600.0000 mg | ORAL_TABLET | Freq: Four times a day (QID) | ORAL | 0 refills | Status: DC | PRN

## 2016-03-08 MED ORDER — IBUPROFEN 800 MG PO TABS
800.0000 mg | ORAL_TABLET | Freq: Once | ORAL | Status: AC
  Administered 2016-03-08: 800 mg via ORAL
  Filled 2016-03-08: qty 1
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
## 2016-03-08 NOTE — ED Notes (Signed)
Patient transported to X-ray 

## 2016-10-12 ENCOUNTER — Emergency Department (HOSPITAL_COMMUNITY): Payer: Self-pay

## 2016-10-12 ENCOUNTER — Emergency Department (HOSPITAL_COMMUNITY)
Admission: EM | Admit: 2016-10-12 | Discharge: 2016-10-12 | Disposition: A | Discharge status: Home / Self Care | Payer: Self-pay | Attending: Emergency Medicine | Admitting: Emergency Medicine

## 2016-10-12 ENCOUNTER — Encounter (HOSPITAL_COMMUNITY): Payer: Self-pay | Admitting: Emergency Medicine

## 2016-10-12 DIAGNOSIS — R112 Nausea with vomiting, unspecified: Secondary | ICD-10-CM | POA: Insufficient documentation

## 2016-10-12 DIAGNOSIS — Z79899 Other long term (current) drug therapy: Secondary | ICD-10-CM | POA: Insufficient documentation

## 2016-10-12 DIAGNOSIS — R0602 Shortness of breath: Secondary | ICD-10-CM | POA: Insufficient documentation

## 2016-10-12 DIAGNOSIS — F1193 Opioid use, unspecified with withdrawal: Secondary | ICD-10-CM

## 2016-10-12 DIAGNOSIS — J069 Acute upper respiratory infection, unspecified: Secondary | ICD-10-CM | POA: Insufficient documentation

## 2016-10-12 DIAGNOSIS — F1123 Opioid dependence with withdrawal: Secondary | ICD-10-CM | POA: Insufficient documentation

## 2016-10-12 DIAGNOSIS — F17210 Nicotine dependence, cigarettes, uncomplicated: Secondary | ICD-10-CM | POA: Insufficient documentation

## 2016-10-12 MED ORDER — ONDANSETRON 4 MG PO TBDP: 4.0000 mg | ORAL_TABLET | Freq: Three times a day (TID) | ORAL | 0 refills | Status: DC | PRN

## 2016-10-12 MED ORDER — CLONIDINE HCL 0.1 MG PO TABS: ORAL_TABLET | ORAL | 0 refills | Status: AC

## 2016-10-12 MED ORDER — DICYCLOMINE HCL 20 MG PO TABS: 20.0000 mg | ORAL_TABLET | Freq: Four times a day (QID) | ORAL | 0 refills | Status: AC | PRN

## 2016-10-12 MED ORDER — HYDROXYZINE HCL 25 MG PO TABS: 25.0000 mg | ORAL_TABLET | Freq: Four times a day (QID) | ORAL | 0 refills | Status: AC | PRN

## 2016-10-12 MED ORDER — LOPERAMIDE HCL 2 MG PO CAPS: 2.0000 mg | ORAL_CAPSULE | Freq: Four times a day (QID) | ORAL | 0 refills | Status: AC | PRN

## 2016-10-12 NOTE — ED Notes (Signed)
ED Provider at bedside. 

## 2016-10-12 NOTE — ED Notes (Signed)
Patient transported to X-ray 

## 2016-10-12 NOTE — ED Triage Notes (Addendum)
Pt reports he began to feel SOB last night that got worse when he started work this am. Pt speaking in full sentences with no apparent difficulty.  Pt has had emesis and diarrhea since yesterday. No abd pain. Pt reports he has had withdrawal from heroin. Last use day before yesterday.

## 2016-10-12 NOTE — ED Provider Notes (Addendum)
[facility]PT Provider Note   CSN: [id number] Arrival date & time: 10/12/16  [id number]     History   Chief Complaint Chief Complaint  Patient presents with  . Shortness of Breath  . Emesis    HPI Robert Phelps is a 46 y.o. male.  HPI Patient presents with shortness of breath and cough. Has been regular for sputum. States he's had sputum for the last year and has been seen both the ER in by his primary care doctor for. States he got numerous tests are not able to find out cause. Has had more sputum production last few days. Also some shortness of breath with it. Now also has nausea vomiting and diarrhea. He attributes this to his heroin withdrawal. Last use yesterday and took the Suboxone yesterday. States he uses a large amount of heroin. Will occasionally use of alcohol. Has been using heavily for the last few months. States he stopped on his own. Would like some help with this. Does have some muscle aches. Has had chills also. He snorts the heroin.   History reviewed. No pertinent past medical history.  Patient Active Problem List   Diagnosis Date Noted  . Hypotension 08/05/2015  . Syncope 08/05/2015  . Polysubstance abuse 08/05/2015  . Arterial hypotension   . Heroin overdose   . Cough 01/15/2015  . Cigarette smoker 01/15/2015  . Hyperprolactinemia (HCC) 01/14/2015    Past Surgical History:  Procedure Laterality Date  . INGUINAL HERNIA REPAIR     right, as child       Home Medications    Prior to Admission medications   Medication Sig Start Date End Date Taking? Authorizing Provider  cloNIDine (CATAPRES) 0.1 MG tablet 1 tablet qid for 4 doses, then 1 tab bid for 4 doses, then 1 tablet before breakfast for 2 doses. 10/12/16   Benjiman CorePickering, Ahtziry Saathoff, MD  dicyclomine (BENTYL) 20 MG tablet Take 1 tablet (20 mg total) by mouth every 6 (six) hours as needed for spasms. 10/12/16 10/17/16  Benjiman CorePickering, Haisley Arens, MD  hydrOXYzine (ATARAX/VISTARIL) 25 MG tablet Take 1 tablet (25 mg  total) by mouth every 6 (six) hours as needed for anxiety. 10/12/16 10/17/16  Benjiman CorePickering, Breshae Belcher, MD  loperamide (IMODIUM) 2 MG capsule Take 1 capsule (2 mg total) by mouth 4 (four) times daily as needed for diarrhea or loose stools. 10/12/16 10/17/16  Benjiman CorePickering, Tamela Elsayed, MD  ondansetron (ZOFRAN-ODT) 4 MG disintegrating tablet Take 1 tablet (4 mg total) by mouth every 8 (eight) hours as needed for nausea or vomiting. 10/12/16   Benjiman CorePickering, Oslo Huntsman, MD    Family History Family History  Problem Relation Age of Onset  . Other Father        AIDS?  . Drug abuse Sister        died of overdose    Social History Social History  Substance Use Topics  . Smoking status: Current Every Day Smoker    Packs/day: 1.50    Years: 23.00    Types: Cigarettes  . Smokeless tobacco: Never Used  . Alcohol use 4.2 oz/week    7 Standard drinks or equivalent per week     Allergies   Patient has no known allergies.   Review of Systems Review of Systems  Constitutional: Positive for appetite change and chills.  HENT: Negative for congestion.   Respiratory: Positive for cough and shortness of breath. Negative for wheezing.   Cardiovascular: Negative for chest pain.  Gastrointestinal: Positive for diarrhea, nausea and vomiting.  Genitourinary: Negative for flank  pain.  Musculoskeletal: Positive for myalgias. Negative for back pain.  Skin: Negative for rash.  Neurological: Negative for speech difficulty.  Hematological: Negative for adenopathy.  Psychiatric/Behavioral: Negative for confusion.     Physical Exam Updated Vital Signs BP 120/83   Pulse 71   Temp 98 F (36.7 C) (Oral)   Resp 16   SpO2 100%   Physical Exam  Constitutional: He appears well-developed.  HENT:  Head: Atraumatic.  Neck: Neck supple.  Cardiovascular: Normal rate.   Pulmonary/Chest: Effort normal.  Abdominal: Soft. There is no tenderness.  Musculoskeletal: Normal range of motion.  Neurological: He is alert.  Skin: Skin is  warm. Capillary refill takes less than 2 seconds.     ED Treatments / Results  Labs (all labs ordered are listed, but only abnormal results are displayed) Labs Reviewed - No data to display  EKG  EKG Interpretation  Date/Time:  Friday Oct 12 2016 07:26:15 EDT Ventricular Rate:  73 PR Interval:    QRS Duration: 80 QT Interval:  389 QTC Calculation: 429 R Axis:   70 Text Interpretation:  Sinus rhythm Normal ECG When compared with ECG of 03/08/2016, No significant change was found Confirmed by Dione Booze ([id number]) on 10/12/2016 7:29:46 AM       Radiology Dg Chest 2 View  Result Date: 10/12/2016 CLINICAL DATA:  Shortness of breath with nausea and vomiting EXAM: CHEST  2 VIEW COMPARISON:  March 08, 2016 FINDINGS: There is no edema or consolidation. The heart size pulmonary vascularity are normal. No pneumothorax. No adenopathy. No bone lesions. IMPRESSION: No edema or consolidation. Electronically Signed   By: Bretta Bang III M.D.   On: 10/12/2016 08:02    Procedures Procedures (including critical care time)  Medications Ordered in ED Medications - No data to display   Initial Impression / Assessment and Plan / ED Course  I have reviewed the triage vital signs and the nursing notes.  Pertinent labs & imaging results that were available during my care of the patient were reviewed by me and considered in my medical decision making (see chart for details).     Patient with cough and some sputum production. X-ray reassuring. Has had previous workup for same. Also heroin withdrawal. Well-appearing but does appear to be withdrawing. Will give resources in terms of follow-up and have given Catapres bentyl Atarax Imodium and Zofran to help with the symptoms.  Final Clinical Impressions(s) / ED Diagnoses   Final diagnoses:  Heroin withdrawal (HCC)  Upper respiratory tract infection, unspecified type    New Prescriptions New Prescriptions   CLONIDINE (CATAPRES) 0.1 MG  TABLET    1 tablet qid for 4 doses, then 1 tab bid for 4 doses, then 1 tablet before breakfast for 2 doses.   DICYCLOMINE (BENTYL) 20 MG TABLET    Take 1 tablet (20 mg total) by mouth every 6 (six) hours as needed for spasms.   HYDROXYZINE (ATARAX/VISTARIL) 25 MG TABLET    Take 1 tablet (25 mg total) by mouth every 6 (six) hours as needed for anxiety.   LOPERAMIDE (IMODIUM) 2 MG CAPSULE    Take 1 capsule (2 mg total) by mouth 4 (four) times daily as needed for diarrhea or loose stools.   ONDANSETRON (ZOFRAN-ODT) 4 MG DISINTEGRATING TABLET    Take 1 tablet (4 mg total) by mouth every 8 (eight) hours as needed for nausea or vomiting.     Benjiman Core, MD 10/12/16 Libby Maw    Benjiman Core, MD 10/30/16 [phone]

## 2017-01-21 ENCOUNTER — Emergency Department (HOSPITAL_COMMUNITY)
Admission: EM | Admit: 2017-01-21 | Discharge: 2017-01-21 | Disposition: A | Discharge status: Home / Self Care | Payer: Self-pay | Attending: Emergency Medicine | Admitting: Emergency Medicine

## 2017-01-21 ENCOUNTER — Encounter (HOSPITAL_COMMUNITY): Payer: Self-pay | Admitting: Emergency Medicine

## 2017-01-21 ENCOUNTER — Emergency Department (HOSPITAL_COMMUNITY): Payer: Self-pay

## 2017-01-21 DIAGNOSIS — R531 Weakness: Secondary | ICD-10-CM

## 2017-01-21 DIAGNOSIS — F1123 Opioid dependence with withdrawal: Secondary | ICD-10-CM | POA: Insufficient documentation

## 2017-01-21 DIAGNOSIS — F17210 Nicotine dependence, cigarettes, uncomplicated: Secondary | ICD-10-CM | POA: Insufficient documentation

## 2017-01-21 DIAGNOSIS — F1193 Opioid use, unspecified with withdrawal: Secondary | ICD-10-CM

## 2017-01-21 LAB — URINALYSIS, ROUTINE W REFLEX MICROSCOPIC
Bilirubin Urine: NEGATIVE
Glucose, UA: NEGATIVE mg/dL
Hgb urine dipstick: NEGATIVE
Ketones, ur: 5 mg/dL — AB
Leukocytes, UA: NEGATIVE
Nitrite: NEGATIVE
PROTEIN: 30 mg/dL — AB
Specific Gravity, Urine: 1.031 — ABNORMAL HIGH (ref 1.005–1.030)
pH: 5 (ref 5.0–8.0)

## 2017-01-21 LAB — COMPREHENSIVE METABOLIC PANEL
ALBUMIN: 4.9 g/dL (ref 3.5–5.0)
ALK PHOS: 70 U/L (ref 38–126)
ALT: 24 U/L (ref 17–63)
ANION GAP: 7 (ref 5–15)
AST: 23 U/L (ref 15–41)
BUN: 8 mg/dL (ref 6–20)
CALCIUM: 9.7 mg/dL (ref 8.9–10.3)
CO2: 27 mmol/L (ref 22–32)
Chloride: 105 mmol/L (ref 101–111)
Creatinine, Ser: 1.01 mg/dL (ref 0.61–1.24)
GFR calc Af Amer: >60 mL/min (ref >60)
GFR calc non Af Amer: >60 mL/min (ref >60)
GLUCOSE: 104 mg/dL — AB (ref 65–99)
Potassium: 4 mmol/L (ref 3.5–5.1)
SODIUM: 139 mmol/L (ref 135–145)
Total Bilirubin: 0.7 mg/dL (ref 0.3–1.2)
Total Protein: 8.4 g/dL — ABNORMAL HIGH (ref 6.5–8.1)

## 2017-01-21 LAB — CBC
HEMATOCRIT: 47 % (ref 39.0–52.0)
HEMOGLOBIN: 16.4 g/dL (ref 13.0–17.0)
MCH: 31.6 pg (ref 26.0–34.0)
MCHC: 34.9 g/dL (ref 30.0–36.0)
MCV: 90.6 fL (ref 78.0–100.0)
Platelets: 274 10*3/uL (ref 150–400)
RBC: 5.19 MIL/uL (ref 4.22–5.81)
RDW: 12.9 % (ref 11.5–15.5)
WBC: 15.6 10*3/uL — ABNORMAL HIGH (ref 4.0–10.5)

## 2017-01-21 LAB — LIPASE, BLOOD: Lipase: 29 U/L (ref 11–51)

## 2017-01-21 MED ORDER — SODIUM CHLORIDE 0.9 % IV BOLUS (SEPSIS)
1000.0000 mL | Freq: Once | INTRAVENOUS | Status: AC
  Administered 2017-01-21: 1000 mL via INTRAVENOUS

## 2017-01-21 MED ORDER — ONDANSETRON 4 MG PO TBDP: 4.0000 mg | ORAL_TABLET | Freq: Three times a day (TID) | ORAL | 0 refills | Status: AC | PRN

## 2017-01-21 MED ORDER — LOPERAMIDE HCL 2 MG PO CAPS: 2.0000 mg | ORAL_CAPSULE | Freq: Four times a day (QID) | ORAL | 0 refills | Status: AC | PRN

## 2017-01-21 NOTE — ED Notes (Signed)
Pt states that he was feeling weak and had to leave work early today. Pt also c/o back muscle spasms, some recent weight loss, with N/V/D approx. 7 episodes of vomiting with diarrhea.

## 2017-01-21 NOTE — ED Notes (Signed)
Pt's IV has been removed at this time. 

## 2017-01-21 NOTE — ED Triage Notes (Signed)
Pt from home with c/o abdominal cramping on and off 2 years. Pt states he recently quit using heroin. He states he was using it to mask his abdominal pain. Pt states he has been seen multiple times for same, and they can never figure out the cause of his abdominal cramps. Pt also states he has left sided flank pain. Pt states he has emesis and diarrhea, but he thinks this is because he recently quit using heroin. Pt reports cramping is most intense in lower left quadrant. Pt is neither febrile nor tachycardic

## 2017-01-21 NOTE — ED Provider Notes (Signed)
[facility]-EMERGENCY DEPT Provider Note   CSN: [id number] Arrival date & time: 01/21/17  1343     History   Chief Complaint Chief Complaint  Patient presents with  . Abdominal Pain  . Weakness    HPI Robert Phelps is a 46 y.o. male.  HPI  46 year old male with a history of heroin abuse presents with a chief complaint of weakness. Felt weak today at work and it felt like his legs were going to give out on him. He states he recently binged on heroin for the last 2 weeks. Stopped 3 days ago. Yesterday developed vomiting and diarrhea that has continued today. No blood in either. No fevers. He endorsed abdominal pain in triage but states that he gets mild abdominal pain just prior to vomiting and then it goes away. No current abdominal pain. However he wants to find out why he has lost much weight over the last couple years. A year ago he weighed 25 pounds heavier than today. He has not tried to lose weight. He has had a chronic cough with some sputum production over the last 2 years. He does smoke cigarettes. He states he is not chronically abused heroin except for every now and then. He thinks the vomiting and diarrhea is likely from heroin withdrawal. He also wants to find out why he keeps getting leg cramps which he states have been ongoing for the last 2 years as well. These occur randomly and last about 5 minutes at a time. It is usually in his thighs or anterior calves.  History reviewed. No pertinent past medical history.  Patient Active Problem List   Diagnosis Date Noted  . Hypotension 08/05/2015  . Syncope 08/05/2015  . Polysubstance abuse 08/05/2015  . Arterial hypotension   . Heroin overdose   . Cough 01/15/2015  . Cigarette smoker 01/15/2015  . Hyperprolactinemia (HCC) 01/14/2015    Past Surgical History:  Procedure Laterality Date  . INGUINAL HERNIA REPAIR     right, as child       Home Medications    Prior to Admission medications   Medication Sig Start Date End  Date Taking? Authorizing Provider  cloNIDine (CATAPRES) 0.1 MG tablet 1 tablet qid for 4 doses, then 1 tab bid for 4 doses, then 1 tablet before breakfast for 2 doses. Patient not taking: Reported on 01/21/2017 10/12/16   Benjiman Core, MD  dicyclomine (BENTYL) 20 MG tablet Take 1 tablet (20 mg total) by mouth every 6 (six) hours as needed for spasms. 10/12/16 10/17/16  Benjiman Core, MD  loperamide (IMODIUM) 2 MG capsule Take 1 capsule (2 mg total) by mouth 4 (four) times daily as needed for diarrhea or loose stools. 01/21/17   Pricilla Loveless, MD  ondansetron (ZOFRAN-ODT) 4 MG disintegrating tablet Take 1 tablet (4 mg total) by mouth every 8 (eight) hours as needed for nausea or vomiting. 01/21/17   Pricilla Loveless, MD    Family History Family History  Problem Relation Age of Onset  . Other Father        AIDS?  . Drug abuse Sister        died of overdose    Social History Social History  Substance Use Topics  . Smoking status: Current Every Day Smoker    Packs/day: 1.50    Years: 23.00    Types: Cigarettes  . Smokeless tobacco: Never Used  . Alcohol use 4.2 oz/week    7 Standard drinks or equivalent per week  Allergies   Patient has no known allergies.   Review of Systems Review of Systems  Constitutional: Positive for chills (at night). Negative for fever.  Respiratory: Positive for cough. Negative for shortness of breath.   Cardiovascular: Negative for chest pain.  Gastrointestinal: Positive for abdominal pain, diarrhea, nausea and vomiting. Negative for blood in stool.  Musculoskeletal: Positive for myalgias.  Neurological: Positive for weakness. Negative for dizziness, light-headedness and headaches.  All other systems reviewed and are negative.    Physical Exam Updated Vital Signs BP 117/87 (BP Location: Left Arm)   Pulse (!) 54   Temp 98.6 F (37 C) (Oral)   Resp (!) 25   Ht 6\' 2"  (1.88 m)   Wt 88 kg (194 lb)   SpO2 100%   BMI 24.91 kg/m    Physical Exam  Constitutional: He is oriented to person, place, and time. He appears well-developed and well-nourished. No distress.  HENT:  Head: Normocephalic and atraumatic.  Right Ear: External ear normal.  Left Ear: External ear normal.  Nose: Nose normal.  Mouth/Throat: Oropharynx is clear and moist. No oropharyngeal exudate.  Eyes: Pupils are equal, round, and reactive to light. EOM are normal. Right eye exhibits no discharge. Left eye exhibits no discharge.  Neck: Neck supple.  Cardiovascular: Normal rate, regular rhythm and normal heart sounds.   Pulmonary/Chest: Effort normal and breath sounds normal.  Abdominal: Soft. He exhibits no distension. There is no tenderness.  Musculoskeletal: He exhibits no edema.  No leg swelling or tenderness bilaterally  Neurological: He is alert and oriented to person, place, and time.  CN 3-12 grossly intact. 5/5 strength in all 4 extremities. Grossly normal sensation. Normal finger to nose.   Skin: Skin is warm and dry. He is not diaphoretic.  Nursing note and vitals reviewed.    ED Treatments / Results  Labs (all labs ordered are listed, but only abnormal results are displayed) Labs Reviewed  COMPREHENSIVE METABOLIC PANEL - Abnormal; Notable for the following:       Result Value   Glucose, Bld 104 (*)    Total Protein 8.4 (*)    All other components within normal limits  CBC - Abnormal; Notable for the following:    WBC 15.6 (*)    All other components within normal limits  URINALYSIS, ROUTINE W REFLEX MICROSCOPIC - Abnormal; Notable for the following:    Specific Gravity, Urine 1.031 (*)    Ketones, ur 5 (*)    Protein, ur 30 (*)    Bacteria, UA FEW (*)    Squamous Epithelial / LPF 0-5 (*)    All other components within normal limits  LIPASE, BLOOD  HIV ANTIBODY (ROUTINE TESTING)    EKG  EKG Interpretation  Date/Time:  Monday January 21 2017 17:31:36 EDT Ventricular Rate:  57 PR Interval:    QRS Duration: 85 QT  Interval:  414 QTC Calculation: 404 R Axis:   74 Text Interpretation:  Sinus rhythm no acute ST/T changes no significant change compared to May 2018 Confirmed by Pricilla Loveless [phone]) on 01/21/2017 5:37:11 PM       Radiology No results found.  Procedures Procedures (including critical care time)  Medications Ordered in ED Medications  sodium chloride 0.9 % bolus 1,000 mL (0 mLs Intravenous Stopped 01/21/17 1801)     Initial Impression / Assessment and Plan / ED Course  I have reviewed the triage vital signs and the nursing notes.  Pertinent labs & imaging results that were available during  my care of the patient were reviewed by me and considered in my medical decision making (see chart for details).     No clear source for the patient's weight loss and cramping. Given his chronic cough, night sweats, and smoking history I recommended chest x-ray but he declines stating had one several months ago. I have also ordered an HIV test and told him this will come back in a few days. However he is overall well-appearing with stable vital signs. His vomiting and diarrhea are likely from heroin withdrawal. No abdominal pain. His white blood cell count is elevated but this appears chronic. He appears stable for discharge, follow-up with PCP. Return precautions.   Final Clinical Impressions(s) / ED Diagnoses   Final diagnoses:  Generalized weakness  Heroin withdrawal (HCC)    New Prescriptions New Prescriptions   LOPERAMIDE (IMODIUM) 2 MG CAPSULE    Take 1 capsule (2 mg total) by mouth 4 (four) times daily as needed for diarrhea or loose stools.     Pricilla Loveless, MD 01/21/17 [id number]

## 2017-01-22 LAB — HIV ANTIBODY (ROUTINE TESTING W REFLEX): HIV Screen 4th Generation wRfx: NONREACTIVE

## 2017-07-26 ENCOUNTER — Encounter (HOSPITAL_COMMUNITY): Payer: Self-pay | Admitting: Emergency Medicine

## 2017-07-26 ENCOUNTER — Emergency Department (HOSPITAL_COMMUNITY)
Admission: EM | Admit: 2017-07-26 | Discharge: 2017-07-26 | Disposition: A | Discharge status: Home / Self Care | Payer: Self-pay | Attending: Emergency Medicine | Admitting: Emergency Medicine

## 2017-07-26 ENCOUNTER — Other Ambulatory Visit: Payer: Self-pay

## 2017-07-26 DIAGNOSIS — F1910 Other psychoactive substance abuse, uncomplicated: Secondary | ICD-10-CM | POA: Insufficient documentation

## 2017-07-26 DIAGNOSIS — F17210 Nicotine dependence, cigarettes, uncomplicated: Secondary | ICD-10-CM | POA: Insufficient documentation

## 2017-07-26 DIAGNOSIS — G4762 Sleep related leg cramps: Secondary | ICD-10-CM | POA: Insufficient documentation

## 2017-07-26 DIAGNOSIS — M62838 Other muscle spasm: Secondary | ICD-10-CM

## 2017-07-26 LAB — COMPREHENSIVE METABOLIC PANEL
ALT: 22 U/L (ref 17–63)
ANION GAP: 11 (ref 5–15)
AST: 25 U/L (ref 15–41)
Albumin: 3.9 g/dL (ref 3.5–5.0)
Alkaline Phosphatase: 69 U/L (ref 38–126)
BUN: 10 mg/dL (ref 6–20)
CHLORIDE: 105 mmol/L (ref 101–111)
CO2: 23 mmol/L (ref 22–32)
CREATININE: 0.87 mg/dL (ref 0.61–1.24)
Calcium: 8.8 mg/dL — ABNORMAL LOW (ref 8.9–10.3)
GFR calc Af Amer: >60 mL/min (ref >60)
Glucose, Bld: 87 mg/dL (ref 65–99)
Potassium: 4.5 mmol/L (ref 3.5–5.1)
SODIUM: 139 mmol/L (ref 135–145)
Total Bilirubin: 0.7 mg/dL (ref 0.3–1.2)
Total Protein: 6.6 g/dL (ref 6.5–8.1)

## 2017-07-26 LAB — CBC WITH DIFFERENTIAL/PLATELET
Basophils Absolute: 0 10*3/uL (ref 0.0–0.1)
Basophils Relative: 0 %
EOS ABS: 0.2 10*3/uL (ref 0.0–0.7)
EOS PCT: 1 %
HCT: 44.5 % (ref 39.0–52.0)
Hemoglobin: 15.2 g/dL (ref 13.0–17.0)
LYMPHS ABS: 2.9 10*3/uL (ref 0.7–4.0)
LYMPHS PCT: 24 %
MCH: 31.8 pg (ref 26.0–34.0)
MCHC: 34.2 g/dL (ref 30.0–36.0)
MCV: 93.1 fL (ref 78.0–100.0)
MONO ABS: 0.8 10*3/uL (ref 0.1–1.0)
Monocytes Relative: 6 %
Neutro Abs: 8.5 10*3/uL — ABNORMAL HIGH (ref 1.7–7.7)
Neutrophils Relative %: 69 %
PLATELETS: 294 10*3/uL (ref 150–400)
RBC: 4.78 MIL/uL (ref 4.22–5.81)
RDW: 13.2 % (ref 11.5–15.5)
WBC: 12.4 10*3/uL — AB (ref 4.0–10.5)

## 2017-07-26 LAB — TSH: TSH: 0.242 u[IU]/mL — AB (ref 0.350–4.500)

## 2017-07-26 LAB — ETHANOL: Alcohol, Ethyl (B): <10 mg/dL (ref <10)

## 2017-07-26 LAB — MAGNESIUM: MAGNESIUM: 2.1 mg/dL (ref 1.7–2.4)

## 2017-07-26 LAB — CK: CK TOTAL: 271 U/L (ref 49–397)

## 2017-07-26 MED ORDER — CYCLOBENZAPRINE HCL 5 MG PO TABS: 5.0000 mg | ORAL_TABLET | Freq: Three times a day (TID) | ORAL | 0 refills | Status: AC | PRN

## 2017-07-26 MED ORDER — SODIUM CHLORIDE 0.9 % IV BOLUS (SEPSIS): 1000.0000 mL | Freq: Once | INTRAVENOUS | Status: DC

## 2017-07-26 MED ORDER — MAGNESIUM 30 MG PO TABS: 30.0000 mg | ORAL_TABLET | Freq: Every day | ORAL | 0 refills | Status: AC

## 2017-07-26 MED ORDER — B COMPLEX VITAMINS PO CAPS: 1.0000 | ORAL_CAPSULE | Freq: Three times a day (TID) | ORAL | 0 refills | Status: DC

## 2017-07-26 NOTE — Discharge Instructions (Signed)
You have muscle spasms. Try to take vitamin B and magnesium supplements as prescribed to help the cramps.   Smoking will make the cramps worse. Please quit smoking.   Take flexeril if you have worse cramps   See a doctor for follow up   Return to ER if you have worse cramps, muscle pain, fever, vomiting

## 2017-07-26 NOTE — ED Triage Notes (Signed)
Pt complaint of generalized spasms/cramps ongoing for 2 years; has been told potassium is low.

## 2017-07-26 NOTE — ED Provider Notes (Signed)
[facility]-EMERGENCY DEPT Provider Note   CSN: [id number] Arrival date & time: 07/26/17  [id number]     History   Chief Complaint Chief Complaint  Patient presents with  . Spasms    HPI Robert Phelps is a 47 y.o. male history of polysubstance abuse (mostly heroin and smoking), presenting with nighttime leg cramps as well as hypokalemia.  Patient states that he has daily nighttime leg cramps and arm cramps that sometimes wakes him up.  He states that during the day, he feels fine.  He states that last night the cramps were very bad so he was unable to sleep.  He states that he still smokes cigarettes.  Patient states that his potassium usually runs a little low but he has been taking a lot of bananas and denies any vomiting or abdominal cramps.  He has some loose stools but denies any diarrhea.  Patient is requesting medicines for cramps and was on Bentyl before but states that has not helped much.   The history is provided by the patient.    History reviewed. No pertinent past medical history.  Patient Active Problem List   Diagnosis Date Noted  . Hypotension 08/05/2015  . Syncope 08/05/2015  . Polysubstance abuse (HCC) 08/05/2015  . Arterial hypotension   . Heroin overdose (HCC)   . Cough 01/15/2015  . Cigarette smoker 01/15/2015  . Hyperprolactinemia (HCC) 01/14/2015    Past Surgical History:  Procedure Laterality Date  . INGUINAL HERNIA REPAIR     right, as child       Home Medications    Prior to Admission medications   Medication Sig Start Date End Date Taking? Authorizing Provider  cloNIDine (CATAPRES) 0.1 MG tablet 1 tablet qid for 4 doses, then 1 tab bid for 4 doses, then 1 tablet before breakfast for 2 doses. Patient not taking: Reported on 01/21/2017 10/12/16   Benjiman CorePickering, Nathan, MD  dicyclomine (BENTYL) 20 MG tablet Take 1 tablet (20 mg total) by mouth every 6 (six) hours as needed for spasms. 10/12/16 10/17/16  Benjiman CorePickering, Nathan, MD    loperamide (IMODIUM) 2 MG capsule Take 1 capsule (2 mg total) by mouth 4 (four) times daily as needed for diarrhea or loose stools. 01/21/17   Pricilla LovelessGoldston, Scott, MD  ondansetron (ZOFRAN-ODT) 4 MG disintegrating tablet Take 1 tablet (4 mg total) by mouth every 8 (eight) hours as needed for nausea or vomiting. 01/21/17   Pricilla LovelessGoldston, Scott, MD    Family History Family History  Problem Relation Age of Onset  . Other Father        AIDS?  . Drug abuse Sister        died of overdose    Social History Social History   Tobacco Use  . Smoking status: Current Every Day Smoker    Packs/day: 1.50    Years: 23.00    Pack years: 34.50    Types: Cigarettes  . Smokeless tobacco: Never Used  Substance Use Topics  . Alcohol use: Yes    Alcohol/week: 4.2 oz    Types: 7 Standard drinks or equivalent per week  . Drug use: Yes    Frequency: 7.0 times per week    Types: Marijuana     Allergies   Patient has no known allergies.   Review of Systems Review of Systems  Musculoskeletal:       Leg cramps   All other systems reviewed and are negative.    Physical Exam Updated Vital Signs BP 115/90 (BP  Location: Right Arm)   Pulse 92   Temp 98 F (36.7 C) (Oral)   Resp 16   SpO2 97%   Physical Exam  Constitutional: He is oriented to person, place, and time. He appears well-developed and well-nourished.  HENT:  Head: Normocephalic.  Mouth/Throat: Oropharynx is clear and moist.  Eyes: Conjunctivae and EOM are normal. Pupils are equal, round, and reactive to light.  Neck: Normal range of motion. Neck supple.  Cardiovascular: Normal rate, regular rhythm and normal heart sounds.  Pulmonary/Chest: Effort normal and breath sounds normal. No stridor. No respiratory distress. He has no wheezes.  Abdominal: Soft. Bowel sounds are normal. He exhibits no distension. There is no guarding.  Musculoskeletal: Normal range of motion. He exhibits no tenderness or deformity.  No obvious calf tenderness  bilaterally   Neurological: He is alert and oriented to person, place, and time. No cranial nerve deficit. Coordination normal.  Skin: Skin is warm.  Psychiatric: He has a normal mood and affect.  Nursing note and vitals reviewed.    ED Treatments / Results  Labs (all labs ordered are listed, but only abnormal results are displayed) Labs Reviewed  CBC WITH DIFFERENTIAL/PLATELET - Abnormal; Notable for the following components:      Result Value   WBC 12.4 (*)    Neutro Abs 8.5 (*)    All other components within normal limits  COMPREHENSIVE METABOLIC PANEL - Abnormal; Notable for the following components:   Calcium 8.8 (*)    All other components within normal limits  MAGNESIUM  ETHANOL  CK  TSH    EKG  EKG Interpretation None       Radiology No results found.  Procedures Procedures (including critical care time)  Medications Ordered in ED Medications - No data to display   Initial Impression / Assessment and Plan / ED Course  I have reviewed the triage vital signs and the nursing notes.  Pertinent labs & imaging results that were available during my care of the patient were reviewed by me and considered in my medical decision making (see chart for details).     Robert Phelps is a 47 y.o. male here with leg cramps at night. States that his potassium was low before but he has no vomiting or excessive diarrhea. Will get labs, CK level.   10:23 AM CK normal. Potassium normal. Calcium slightly low at 8.8. Magnesium normal. Reviewed uptodate on night cramps. There is evidence for vit B complex and magnesium to prevent night cramps. Smoking cessation will help as well. Has good peripheral pulses currently and there is no signs of limb ischemia. Will give flexeril prn as well. Stable for discharge.   Final Clinical Impressions(s) / ED Diagnoses   Final diagnoses:  None    ED Discharge Orders    None       Charlynne Pander, MD 07/26/17 1025

## 2017-07-26 NOTE — ED Notes (Signed)
Bed: WA05 Expected date:  Expected time:  Means of arrival:  Comments: 

## 2018-07-04 ENCOUNTER — Other Ambulatory Visit: Payer: Self-pay

## 2018-07-04 ENCOUNTER — Encounter (HOSPITAL_COMMUNITY): Payer: Self-pay

## 2018-07-04 ENCOUNTER — Emergency Department (HOSPITAL_COMMUNITY)
Admission: EM | Admit: 2018-07-04 | Discharge: 2018-07-04 | Discharge status: Against Medical Advice | Payer: Self-pay | Attending: Emergency Medicine | Admitting: Emergency Medicine

## 2018-07-04 DIAGNOSIS — T50901A Poisoning by unspecified drugs, medicaments and biological substances, accidental (unintentional), initial encounter: Secondary | ICD-10-CM | POA: Insufficient documentation

## 2018-07-04 DIAGNOSIS — Z5329 Procedure and treatment not carried out because of patient's decision for other reasons: Secondary | ICD-10-CM | POA: Insufficient documentation

## 2018-07-04 DIAGNOSIS — F17210 Nicotine dependence, cigarettes, uncomplicated: Secondary | ICD-10-CM | POA: Insufficient documentation

## 2018-07-04 DIAGNOSIS — Z79899 Other long term (current) drug therapy: Secondary | ICD-10-CM | POA: Insufficient documentation

## 2018-07-04 LAB — CBC
HEMATOCRIT: 44.3 % (ref 39.0–52.0)
HEMOGLOBIN: 14.3 g/dL (ref 13.0–17.0)
MCH: 30.7 pg (ref 26.0–34.0)
MCHC: 32.3 g/dL (ref 30.0–36.0)
MCV: 95.1 fL (ref 80.0–100.0)
NRBC: 0 % (ref 0.0–0.2)
Platelets: 249 10*3/uL (ref 150–400)
RBC: 4.66 MIL/uL (ref 4.22–5.81)
RDW: 13.1 % (ref 11.5–15.5)
WBC: 13.6 10*3/uL — AB (ref 4.0–10.5)

## 2018-07-04 LAB — COMPREHENSIVE METABOLIC PANEL
ALT: 20 U/L (ref 0–44)
AST: 25 U/L (ref 15–41)
Albumin: 4.7 g/dL (ref 3.5–5.0)
Alkaline Phosphatase: 77 U/L (ref 38–126)
Anion gap: 9 (ref 5–15)
BUN: 10 mg/dL (ref 6–20)
CO2: 30 mmol/L (ref 22–32)
CREATININE: 0.96 mg/dL (ref 0.61–1.24)
Calcium: 8.8 mg/dL — ABNORMAL LOW (ref 8.9–10.3)
Chloride: 99 mmol/L (ref 98–111)
Glucose, Bld: 117 mg/dL — ABNORMAL HIGH (ref 70–99)
POTASSIUM: 3.4 mmol/L — AB (ref 3.5–5.1)
SODIUM: 138 mmol/L (ref 135–145)
Total Bilirubin: 0.5 mg/dL (ref 0.3–1.2)
Total Protein: 8 g/dL (ref 6.5–8.1)

## 2018-07-04 LAB — RAPID URINE DRUG SCREEN, HOSP PERFORMED
AMPHETAMINES: NOT DETECTED
BARBITURATES: NOT DETECTED
Benzodiazepines: NOT DETECTED
COCAINE: NOT DETECTED
Opiates: POSITIVE — AB
TETRAHYDROCANNABINOL: POSITIVE — AB

## 2018-07-04 LAB — ETHANOL: Alcohol, Ethyl (B): <10 mg/dL (ref <10)

## 2018-07-04 NOTE — ED Provider Notes (Addendum)
[facility]-EMERGENCY DEPT Provider Note   CSN: [id number] Arrival date & time: 07/04/18  1011     History   Chief Complaint Chief Complaint  Patient presents with  . Altered Mental Status    HPI Robert Phelps is a 48 y.o. male.  HPI   48 year old male with a history of polysubstance abuse presents with concern for sleepiness after taking a medication from a friend this morning.  Reports that he was going to a job interview, and took what he thought was Motrin from his brother's friend, and after that began to feel very sleepy.  Felt tired and confused like he could not keep his eyes open.  He reports is not sure what the medication was, as they thought it was Motrin.  Reports he just took 1 capsule.  Denies any other medical concerns, including no fever, vomiting.  Denies taking other substances this morning.  Denies thoughts of hurting himself or others.  History reviewed. No pertinent past medical history.  Patient Active Problem List   Diagnosis Date Noted  . Hypotension 08/05/2015  . Syncope 08/05/2015  . Polysubstance abuse (HCC) 08/05/2015  . Arterial hypotension   . Heroin overdose (HCC)   . Cough 01/15/2015  . Cigarette smoker 01/15/2015  . Hyperprolactinemia (HCC) 01/14/2015    Past Surgical History:  Procedure Laterality Date  . INGUINAL HERNIA REPAIR     right, as child        Home Medications    Prior to Admission medications   Medication Sig Start Date End Date Taking? Authorizing Provider  VITAMIN E PO Take 1 tablet by mouth daily.   Yes [provider]  cloNIDine (CATAPRES) 0.1 MG tablet 1 tablet qid for 4 doses, then 1 tab bid for 4 doses, then 1 tablet before breakfast for 2 doses. Patient not taking: Reported on 01/21/2017 10/12/16   Benjiman Core, MD  cyclobenzaprine (FLEXERIL) 5 MG tablet Take 1 tablet (5 mg total) by mouth 3 (three) times daily as needed for muscle spasms. Patient not taking: Reported on  07/04/2018 07/26/17   Charlynne Pander, MD  dicyclomine (BENTYL) 20 MG tablet Take 1 tablet (20 mg total) by mouth every 6 (six) hours as needed for spasms. 10/12/16 10/17/16  Benjiman Core, MD  loperamide (IMODIUM) 2 MG capsule Take 1 capsule (2 mg total) by mouth 4 (four) times daily as needed for diarrhea or loose stools. Patient not taking: Reported on 07/04/2018 01/21/17   Pricilla Loveless, MD  magnesium 30 MG tablet Take 1 tablet (30 mg total) by mouth daily. Patient not taking: Reported on 07/04/2018 07/26/17   Charlynne Pander, MD  ondansetron (ZOFRAN-ODT) 4 MG disintegrating tablet Take 1 tablet (4 mg total) by mouth every 8 (eight) hours as needed for nausea or vomiting. Patient not taking: Reported on 07/04/2018 01/21/17   Pricilla Loveless, MD    Family History Family History  Problem Relation Age of Onset  . Other Father        AIDS?  . Drug abuse Sister        died of overdose    Social History Social History   Tobacco Use  . Smoking status: Current Every Day Smoker    Packs/day: 1.50    Years: 23.00    Pack years: 34.50    Types: Cigarettes  . Smokeless tobacco: Never Used  Substance Use Topics  . Alcohol use: Yes    Alcohol/week: 7.0 standard drinks    Types: 7 Standard  drinks or equivalent per week  . Drug use: Yes    Frequency: 7.0 times per week    Types: Marijuana     Allergies   Patient has no known allergies.   Review of Systems Review of Systems   Physical Exam Updated Vital Signs BP 118/84 (BP Location: Right Arm)   Pulse 73   Temp 97.7 F (36.5 C) (Oral)   Resp 18   Ht 6\' 2"  (1.88 m)   SpO2 97%   BMI 24.91 kg/m   Physical Exam Vitals signs and nursing note reviewed.  Constitutional:      General: He is not in acute distress.    Appearance: He is well-developed. He is not diaphoretic.     Comments: Sleepy but responds to questions appropriately   HENT:     Head: Normocephalic and atraumatic.  Eyes:     Conjunctiva/sclera:  Conjunctivae normal.  Neck:     Musculoskeletal: Normal range of motion.  Cardiovascular:     Rate and Rhythm: Normal rate and regular rhythm.     Heart sounds: Normal heart sounds. No murmur. No friction rub. No gallop.   Pulmonary:     Effort: Pulmonary effort is normal. No respiratory distress.     Breath sounds: Normal breath sounds. No wheezing or rales.  Abdominal:     General: There is no distension.     Palpations: Abdomen is soft.     Tenderness: There is no abdominal tenderness. There is no guarding.  Skin:    General: Skin is warm and dry.  Neurological:     Mental Status: He is oriented to person, place, and time.      ED Treatments / Results  Labs (all labs ordered are listed, but only abnormal results are displayed) Labs Reviewed  COMPREHENSIVE METABOLIC PANEL - Abnormal; Notable for the following components:      Result Value   Potassium 3.4 (*)    Glucose, Bld 117 (*)    Calcium 8.8 (*)    All other components within normal limits  CBC - Abnormal; Notable for the following components:   WBC 13.6 (*)    All other components within normal limits  RAPID URINE DRUG SCREEN, HOSP PERFORMED - Abnormal; Notable for the following components:   Opiates POSITIVE (*)    Tetrahydrocannabinol POSITIVE (*)    All other components within normal limits  ETHANOL    EKG None  Radiology No results found.  Procedures Procedures (including critical care time)  Medications Ordered in ED Medications - No data to display   Initial Impression / Assessment and Plan / ED Course  I have reviewed the triage vital signs and the nursing notes.  Pertinent labs & imaging results that were available during my care of the patient were reviewed by me and considered in my medical decision making (see chart for details).     48 year old male with a history of polysubstance abuse presents with concern for sleepiness after taking a medication from a friend this morning.  Patient  hemodynamically stable, is sleepy but appropriate.  Labs WNL.  Given he reports only taking one pill, told patient we would watch him until he was more awake. While in the ED, he became more alert and walked out of the ED.  Final Clinical Impressions(s) / ED Diagnoses   Final diagnoses:  Accidental drug ingestion, initial encounter    ED Discharge Orders    None       Alvira MondaySchlossman, Landree Fernholz, MD  07/04/18 2222    Alvira Monday, MD 07/24/18 1123

## 2018-07-04 NOTE — ED Notes (Signed)
Patient ambulated out of ED without assistance and without notifying personnel that he was leaving. Patient VSS and neuro status was WNL prior to patient elopement. MD Schlossman notified.

## 2018-07-04 NOTE — ED Triage Notes (Addendum)
Patient BIB [facility] from home. Patient's acquaintance was picking patient up for a job interview, and gave patient "a pill that I thought was motrin." Since then, the patient reports feeling "tired and confused, like I cant keep my eyes open." Patient fell asleep while responding in triage, but is easily arousable. VSS for EMS. CBG=148. Patient denies history or taking daily medications.

## 2018-07-04 NOTE — ED Notes (Signed)
Bed: [facility] Expected date:  Expected time:  Means of arrival:  Comments: 47 AMS

## 2018-07-30 ENCOUNTER — Other Ambulatory Visit: Payer: Self-pay

## 2018-07-30 ENCOUNTER — Emergency Department (HOSPITAL_COMMUNITY): Payer: Self-pay

## 2018-07-30 ENCOUNTER — Emergency Department (HOSPITAL_COMMUNITY)
Admission: EM | Admit: 2018-07-30 | Discharge: 2018-07-30 | Disposition: A | Discharge status: Home / Self Care | Payer: Self-pay | Attending: Emergency Medicine | Admitting: Emergency Medicine

## 2018-07-30 ENCOUNTER — Encounter (HOSPITAL_COMMUNITY): Payer: Self-pay

## 2018-07-30 DIAGNOSIS — F17210 Nicotine dependence, cigarettes, uncomplicated: Secondary | ICD-10-CM | POA: Insufficient documentation

## 2018-07-30 DIAGNOSIS — Z79899 Other long term (current) drug therapy: Secondary | ICD-10-CM | POA: Insufficient documentation

## 2018-07-30 DIAGNOSIS — R109 Unspecified abdominal pain: Secondary | ICD-10-CM | POA: Insufficient documentation

## 2018-07-30 LAB — URINALYSIS, ROUTINE W REFLEX MICROSCOPIC
Bilirubin Urine: NEGATIVE
Glucose, UA: NEGATIVE mg/dL
Ketones, ur: NEGATIVE mg/dL
NITRITE: NEGATIVE
Protein, ur: NEGATIVE mg/dL
SPECIFIC GRAVITY, URINE: 1.012 (ref 1.005–1.030)
pH: 6 (ref 5.0–8.0)

## 2018-07-30 MED ORDER — IBUPROFEN 800 MG PO TABS: 800.0000 mg | ORAL_TABLET | Freq: Three times a day (TID) | ORAL | 0 refills | Status: AC | PRN

## 2018-07-30 NOTE — ED Provider Notes (Signed)
[facility]-EMERGENCY DEPT Provider Note   CSN: [id number] Arrival date & time: 07/30/18  0747    History   Chief Complaint Chief Complaint  Patient presents with  . Flank Pain  . Urinary Frequency    HPI Robert Phelps is a 48 y.o. male.     Patient complains of right flank pain.  Worse with movement.  Patient does a lot of digging at work  The history is provided by the patient. No language interpreter was used.  Flank Pain  This is a new problem. The current episode started 2 days ago. The problem occurs constantly. The problem has not changed since onset.Pertinent negatives include no chest pain, no abdominal pain and no headaches. The symptoms are aggravated by bending. Nothing relieves the symptoms. He has tried nothing for the symptoms. The treatment provided no relief.    History reviewed. No pertinent past medical history.  Patient Active Problem List   Diagnosis Date Noted  . Hypotension 08/05/2015  . Syncope 08/05/2015  . Polysubstance abuse (HCC) 08/05/2015  . Arterial hypotension   . Heroin overdose (HCC)   . Cough 01/15/2015  . Cigarette smoker 01/15/2015  . Hyperprolactinemia (HCC) 01/14/2015    Past Surgical History:  Procedure Laterality Date  . INGUINAL HERNIA REPAIR     right, as child        Home Medications    Prior to Admission medications   Medication Sig Start Date End Date Taking? Authorizing Provider  Cyanocobalamin (B-12 PO) Take 1 tablet by mouth daily.   Yes [provider]  GARLIC PO Take 1 capsule by mouth daily.   Yes [provider]  cloNIDine (CATAPRES) 0.1 MG tablet 1 tablet qid for 4 doses, then 1 tab bid for 4 doses, then 1 tablet before breakfast for 2 doses. Patient not taking: Reported on 01/21/2017 10/12/16   Benjiman Core, MD  cyclobenzaprine (FLEXERIL) 5 MG tablet Take 1 tablet (5 mg total) by mouth 3 (three) times daily as needed for muscle spasms. Patient not taking:  Reported on 07/04/2018 07/26/17   Charlynne Pander, MD  dicyclomine (BENTYL) 20 MG tablet Take 1 tablet (20 mg total) by mouth every 6 (six) hours as needed for spasms. Patient not taking: Reported on 07/30/2018 10/12/16 10/17/16  Benjiman Core, MD  ibuprofen (ADVIL,MOTRIN) 800 MG tablet Take 1 tablet (800 mg total) by mouth every 8 (eight) hours as needed. 07/30/18   Bethann Berkshire, MD  loperamide (IMODIUM) 2 MG capsule Take 1 capsule (2 mg total) by mouth 4 (four) times daily as needed for diarrhea or loose stools. Patient not taking: Reported on 07/04/2018 01/21/17   Pricilla Loveless, MD  magnesium 30 MG tablet Take 1 tablet (30 mg total) by mouth daily. Patient not taking: Reported on 07/04/2018 07/26/17   Charlynne Pander, MD  ondansetron (ZOFRAN-ODT) 4 MG disintegrating tablet Take 1 tablet (4 mg total) by mouth every 8 (eight) hours as needed for nausea or vomiting. Patient not taking: Reported on 07/04/2018 01/21/17   Pricilla Loveless, MD    Family History Family History  Problem Relation Age of Onset  . Other Father        AIDS?  . Drug abuse Sister        died of overdose    Social History Social History   Tobacco Use  . Smoking status: Current Every Day Smoker    Packs/day: 1.50    Years: 23.00    Pack years: 34.50  Types: Cigarettes  . Smokeless tobacco: Never Used  Substance Use Topics  . Alcohol use: Yes    Alcohol/week: 7.0 standard drinks    Types: 7 Standard drinks or equivalent per week  . Drug use: Yes    Frequency: 7.0 times per week    Types: Marijuana    Comment: heroin also     Allergies   Patient has no known allergies.   Review of Systems Review of Systems  Constitutional: Negative for appetite change and fatigue.  HENT: Negative for congestion, ear discharge and sinus pressure.   Eyes: Negative for discharge.  Respiratory: Negative for cough.   Cardiovascular: Negative for chest pain.  Gastrointestinal: Negative for abdominal pain and  diarrhea.  Genitourinary: Positive for flank pain. Negative for frequency and hematuria.  Musculoskeletal: Negative for back pain.  Skin: Negative for rash.  Neurological: Negative for seizures and headaches.  Psychiatric/Behavioral: Negative for hallucinations.     Physical Exam Updated Vital Signs BP 136/75 (BP Location: Right Arm)   Pulse (!) 55   Temp 98.7 F (37.1 C) (Oral)   Resp 17   Ht  (1.88 m)   Wt 86.2 kg   SpO2 98%   BMI 24.39 kg/m   Physical Exam Vitals signs and nursing note reviewed.  Constitutional:      Appearance: He is well-developed.  HENT:     Head: Normocephalic.     Nose: Nose normal.  Eyes:     General: No scleral icterus.    Conjunctiva/sclera: Conjunctivae normal.  Neck:     Musculoskeletal: Neck supple.     Thyroid: No thyromegaly.  Cardiovascular:     Rate and Rhythm: Normal rate and regular rhythm.     Heart sounds: No murmur. No friction rub. No gallop.   Pulmonary:     Breath sounds: No stridor. No wheezing or rales.  Chest:     Chest wall: No tenderness.  Abdominal:     General: There is no distension.     Tenderness: There is no abdominal tenderness. There is no rebound.  Genitourinary:    Comments: Tender right flank Musculoskeletal: Normal range of motion.  Lymphadenopathy:     Cervical: No cervical adenopathy.  Skin:    Findings: No erythema or rash.  Neurological:     Mental Status: He is oriented to person, place, and time.     Motor: No abnormal muscle tone.     Coordination: Coordination normal.  Psychiatric:        Behavior: Behavior normal.      ED Treatments / Results  Labs (all labs ordered are listed, but only abnormal results are displayed) Labs Reviewed  URINALYSIS, ROUTINE W REFLEX MICROSCOPIC - Abnormal; Notable for the following components:      Result Value   Hgb urine dipstick SMALL (*)    Leukocytes,Ua MODERATE (*)    Bacteria, UA RARE (*)    All other components within normal limits     EKG None  Radiology No results found.  Procedures Procedures (including critical care time)  Medications Ordered in ED Medications - No data to display   Initial Impression / Assessment and Plan / ED Course  I have reviewed the triage vital signs and the nursing notes.  Pertinent labs & imaging results that were available during my care of the patient were reviewed by me and considered in my medical decision making (see chart for details).       Urinalysis negative.  Patient refused  x-rays.  Patient will follow-up with PCP and is given Motrin  Final Clinical Impressions(s) / ED Diagnoses   Final diagnoses:  Right flank pain    ED Discharge Orders         Ordered    ibuprofen (ADVIL,MOTRIN) 800 MG tablet  Every 8 hours PRN     07/30/18 1112           Bethann Berkshire, MD 07/30/18 1114

## 2018-07-30 NOTE — ED Notes (Addendum)
Patient at nurses station being rude and cursing at staff stating "This place is terrible and I have been here forever. I am agitated" Tim, Consulting civil engineer, informed

## 2018-07-30 NOTE — Discharge Instructions (Addendum)
Follow up if not improving

## 2018-07-30 NOTE — ED Triage Notes (Signed)
Patient c/o intermittent right flank pain x 2 weeks,but says it more consistent for the past 2 -3 days. Patient also c/o urinary frequency.

## 2018-08-27 ENCOUNTER — Ambulatory Visit: Payer: Self-pay | Attending: Family Medicine | Admitting: Family Medicine

## 2018-08-27 ENCOUNTER — Encounter: Payer: Self-pay | Admitting: Family Medicine

## 2018-08-27 ENCOUNTER — Other Ambulatory Visit: Payer: Self-pay

## 2018-08-27 VITALS — BP 102/68 | HR 73 | Temp 98.3°F | Resp 18 | Ht 74.0 in | Wt 198.0 lb

## 2018-08-27 DIAGNOSIS — R350 Frequency of micturition: Secondary | ICD-10-CM

## 2018-08-27 DIAGNOSIS — R10A1 Flank pain, right side: Secondary | ICD-10-CM

## 2018-08-27 DIAGNOSIS — R319 Hematuria, unspecified: Secondary | ICD-10-CM

## 2018-08-27 DIAGNOSIS — R109 Unspecified abdominal pain: Secondary | ICD-10-CM

## 2018-08-27 LAB — POCT URINALYSIS DIP (CLINITEK)
Bilirubin, UA: NEGATIVE
Glucose, UA: NEGATIVE mg/dL
Ketones, POC UA: NEGATIVE mg/dL
Leukocytes, UA: NEGATIVE
Nitrite, UA: NEGATIVE
POC PROTEIN,UA: NEGATIVE
Spec Grav, UA: 1.025
Urobilinogen, UA: 0.2 U/dL
pH, UA: 5.5

## 2018-08-27 MED ORDER — CIPROFLOXACIN HCL 500 MG PO TABS: 500.0000 mg | ORAL_TABLET | Freq: Two times a day (BID) | ORAL | 0 refills | Status: AC

## 2018-08-27 MED ORDER — CIPROFLOXACIN HCL 500 MG PO TABS: 500.0000 mg | ORAL_TABLET | Freq: Two times a day (BID) | ORAL | 0 refills | Status: DC

## 2018-08-27 MED FILL — CIPROFLOXACIN HCL 500 MG TA: 500 | 7 days supply | Qty: 14 | Fill #0

## 2018-08-27 NOTE — Addendum Note (Signed)
Addended by: Candi Leash on: 08/27/2018 03:23 PM   Modules accepted: Orders

## 2018-08-27 NOTE — Progress Notes (Signed)
Established Patient Office Visit  Subjective:  Patient ID: Robert Phelps, male    DOB: [birth year]  Age: 48 y.o. MRN: [id number]  CC: No chief complaint on file.   HPI Robert Phelps,  64 male last seen in the office on 08/16/15 for hospital discharge follow-up of polysubstance abuse and heroin overdose from which his wife was able to revive him with the use of Narcan, who presents in follow-up of ED visit on 07/30/2018 due to the complaint of right flank pain. He did have moderate leukocytes on UA however it does not appear that urine culture was done.       At today's visit, patient reports that he continues to have recurrent pain in his right side.  Patient states that the pain is a dull but sometimes sharp aching sensation.  Patient states that he cannot reproduce the pain by touching the area and the pain is not affected by touching the area when he is having the pain.  Patient continues to have issues with frequent urination.  Patient denies dysuria and no penile discharge.  Patient has family history of paternal grandmother with diabetes and mother with hypertension.  Patient has had no prior diagnosis of diabetes or prediabetes of which he is aware.    Past Surgical History:  Procedure Laterality Date  . INGUINAL HERNIA REPAIR     right, as child    Family History  Problem Relation Age of Onset  . Other Father        AIDS?  . Drug abuse Sister        died of overdose    Social History   Tobacco Use  . Smoking status: Current Every Day Smoker    Packs/day: 1.50    Years: 23.00    Pack years: 34.50    Types: Cigarettes  . Smokeless tobacco: Never Used  Substance Use Topics  . Alcohol use: Yes    Alcohol/week: 7.0 standard drinks    Types: 7 Standard drinks or equivalent per week  . Drug use: Yes    Frequency: 7.0 times per week    Types: Marijuana    Comment: heroin also    Outpatient Medications Prior to Visit  Medication Sig Dispense Refill  . cloNIDine  (CATAPRES) 0.1 MG tablet 1 tablet qid for 4 doses, then 1 tab bid for 4 doses, then 1 tablet before breakfast for 2 doses. (Patient not taking: Reported on 01/21/2017) 10 tablet 0  . Cyanocobalamin (B-12 PO) Take 1 tablet by mouth daily.    . cyclobenzaprine (FLEXERIL) 5 MG tablet Take 1 tablet (5 mg total) by mouth 3 (three) times daily as needed for muscle spasms. (Patient not taking: Reported on 07/04/2018) 10 tablet 0  . dicyclomine (BENTYL) 20 MG tablet Take 1 tablet (20 mg total) by mouth every 6 (six) hours as needed for spasms. (Patient not taking: Reported on 07/30/2018) 10 tablet 0  . GARLIC PO Take 1 capsule by mouth daily.    Marland Kitchen ibuprofen (ADVIL,MOTRIN) 800 MG tablet Take 1 tablet (800 mg total) by mouth every 8 (eight) hours as needed. 21 tablet 0  . loperamide (IMODIUM) 2 MG capsule Take 1 capsule (2 mg total) by mouth 4 (four) times daily as needed for diarrhea or loose stools. (Patient not taking: Reported on 07/04/2018) 12 capsule 0  . magnesium 30 MG tablet Take 1 tablet (30 mg total) by mouth daily. (Patient not taking: Reported on 07/04/2018) 30 tablet 0  . ondansetron (ZOFRAN-ODT) 4  MG disintegrating tablet Take 1 tablet (4 mg total) by mouth every 8 (eight) hours as needed for nausea or vomiting. (Patient not taking: Reported on 07/04/2018) 8 tablet 0   No facility-administered medications prior to visit.     No Known Allergies  ROS Review of Systems  Constitutional: Positive for chills and fatigue. Negative for fever.  Respiratory: Negative for cough and shortness of breath.   Gastrointestinal: Negative for abdominal pain, blood in stool, constipation, diarrhea and nausea.  Endocrine: Positive for polyuria. Negative for polydipsia and polyphagia.  Genitourinary: Positive for flank pain and frequency. Negative for difficulty urinating, discharge and dysuria.  Musculoskeletal: Negative for arthralgias and back pain.  Neurological: Negative for dizziness and headaches.    Hematological: Negative for adenopathy. Does not bruise/bleed easily.      Objective:    Physical Exam  Constitutional: He is oriented to person, place, and time. He appears well-developed and well-nourished.  Cardiovascular: Normal rate and regular rhythm.  Pulmonary/Chest: Effort normal and breath sounds normal.  Abdominal: Soft. There is no abdominal tenderness. There is no rebound and no guarding.  Musculoskeletal: Normal range of motion.        General: Tenderness present. No edema.     Comments: Tenderness to palp over right flank midaxillary below the rib cage  Neurological: He is alert and oriented to person, place, and time.  Skin: Skin is warm and dry.  Psychiatric: He has a normal mood and affect. His behavior is normal. Judgment and thought content normal.  Nursing note and vitals reviewed.   BP 102/68 (BP Location: Left Arm, Patient Position: Sitting, Cuff Size: Large)   Pulse 73   Temp 98.3 F (36.8 C) (Oral)   Resp 18   Ht 6\' 2"  (1.88 m)   Wt 198 lb (89.8 kg)   SpO2 97%   BMI 25.42 kg/m  Wt Readings from Last 3 Encounters:  07/30/18 190 lb (86.2 kg)  01/21/17 194 lb (88 kg)  02/03/16 215 lb (97.5 kg)     Health Maintenance Due  Topic Date Due  . TETANUS/TDAP  02/16/1992  . INFLUENZA VACCINE  01/02/2018      Lab Results  Component Value Date   TSH 0.242 (L) 07/26/2017   Lab Results  Component Value Date   WBC 13.6 (H) 07/04/2018   HGB 14.3 07/04/2018   HCT 44.3 07/04/2018   MCV 95.1 07/04/2018   PLT 249 07/04/2018   Lab Results  Component Value Date   NA 138 07/04/2018   K 3.4 (L) 07/04/2018   CO2 30 07/04/2018   GLUCOSE 117 (H) 07/04/2018   BUN 10 07/04/2018   CREATININE 0.96 07/04/2018   BILITOT 0.5 07/04/2018   ALKPHOS 77 07/04/2018   AST 25 07/04/2018   ALT 20 07/04/2018   PROT 8.0 07/04/2018   ALBUMIN 4.7 07/04/2018   CALCIUM 8.8 (L) 07/04/2018   ANIONGAP 9 07/04/2018   No results found for: CHOL No results found for:  HDL No results found for: LDLCALC No results found for: TRIG No results found for: CHOLHDL No results found for: ZCHY8F    Assessment & Plan:  1. Right flank pain Patient was asked to give sample for UA due to right flank pain and abnormal UA at his recent ED visit. As patient with abnormal UA with hematuria, patient will have renal US in follow-up of his flank pain. He denies any history of kidney stones.  - POCT URINALYSIS DIP (CLINITEK) - US Renal; Future  2. Urinary frequency UA done and patient will have Hgb A1c. Patient had trace hematuria on UA. Patient with glucose of 117 in ED and will check Hgb A1c to look for possible pre-diabetes or diabetes as a cause of his urinary frequency - US Renal; Future - Hemoglobin A1c  3. Hematuria, unspecified type Patient with abnormal UA with trace blood and patient will be placed on Cipro, 500 mg twice per day for 7 days in case of UTI. Patient will have renal US in follow-up of his hematuria and right flank pain. Return in 2 weeks for lab visit to repeat UA and inform clinic if no improvement in flank pain.  - US Renal; Future    Follow-up: Return for 2 week lab visit to repeat UA.   Cain Saupe, MD

## 2018-08-28 LAB — HEMOGLOBIN A1C
Est. average glucose Bld gHb Est-mCnc: 105 mg/dL
Hgb A1c MFr Bld: 5.3 % (ref 4.8–5.6)

## 2018-09-05 ENCOUNTER — Telehealth: Payer: Self-pay | Admitting: *Deleted

## 2018-09-05 ENCOUNTER — Other Ambulatory Visit: Payer: Self-pay

## 2018-09-05 ENCOUNTER — Ambulatory Visit
Admission: RE | Admit: 2018-09-05 | Discharge: 2018-09-05 | Disposition: A | Discharge status: Home / Self Care | Payer: Self-pay | Source: Ambulatory Visit | Attending: Family Medicine | Admitting: Family Medicine

## 2018-09-05 DIAGNOSIS — R109 Unspecified abdominal pain: Secondary | ICD-10-CM

## 2018-09-05 DIAGNOSIS — R350 Frequency of micturition: Secondary | ICD-10-CM

## 2018-09-05 DIAGNOSIS — R319 Hematuria, unspecified: Secondary | ICD-10-CM

## 2018-09-05 NOTE — Telephone Encounter (Signed)
-----   Message from Cain Saupe, MD sent at 09/05/2018  2:49 PM EDT ----- Please notify patient that the ultrasound of his kidneys and bladder was normal. Can he come in to repeat a UA. If he still has blood in the urine or if he is still have urinary issues then he needs to be referred to Urology

## 2018-09-05 NOTE — Telephone Encounter (Signed)
Patient verified DOB Patient is aware of results being negative. Patient complains of increased pain while sleeping that wakes him out of his sleep to urinate. Patient will report to the office on Monday at 5:00 to drop a sample and then be referred to urology.

## 2018-09-10 ENCOUNTER — Ambulatory Visit: Payer: Self-pay | Attending: Family Medicine

## 2018-09-10 ENCOUNTER — Telehealth: Payer: Self-pay | Admitting: *Deleted

## 2018-09-10 ENCOUNTER — Other Ambulatory Visit: Payer: Self-pay | Admitting: Family Medicine

## 2018-09-10 ENCOUNTER — Other Ambulatory Visit: Payer: Self-pay

## 2018-09-10 DIAGNOSIS — R319 Hematuria, unspecified: Secondary | ICD-10-CM

## 2018-09-10 DIAGNOSIS — R350 Frequency of micturition: Secondary | ICD-10-CM

## 2018-09-10 DIAGNOSIS — R10A1 Flank pain, right side: Secondary | ICD-10-CM

## 2018-09-10 DIAGNOSIS — R109 Unspecified abdominal pain: Secondary | ICD-10-CM

## 2018-09-10 LAB — POCT URINALYSIS DIP (CLINITEK)
Bilirubin, UA: NEGATIVE
Blood, UA: NEGATIVE
Glucose, UA: NEGATIVE mg/dL
Ketones, POC UA: NEGATIVE mg/dL
Leukocytes, UA: NEGATIVE
Nitrite, UA: NEGATIVE
POC PROTEIN,UA: 30 — AB
Spec Grav, UA: >=1.03 — AB
Urobilinogen, UA: 0.2 U/dL
pH, UA: 5.5

## 2018-09-10 NOTE — Telephone Encounter (Signed)
Patient verified DOB Patient is aware of urine showing now concerns along with blood work and [place]. Patient is aware of protein being present in his urine so he is being referred to urology and nephrology just for evaluation. Patient is aware of a repeat being completed in 2 weeks. Patient states he drinks a protein shake daily.

## 2018-09-10 NOTE — Progress Notes (Signed)
Patient ID: Robert Phelps, male   DOB: July 28, 1970, 48 y.o.   MRN: [id number]   Urology referral being placed as patient with urinary frequency and right flank pain. Trace hematuria x 1 on UA but not on repeat UA.

## 2018-11-19 IMAGING — CR DG CHEST 2V
2 series · 2 of 2 positions shown · non-contrast
Comparison: March 08, 2016

CLINICAL DATA: Shortness of breath with nausea and vomiting

EXAM:
CHEST  2 VIEW

[w chest pa]
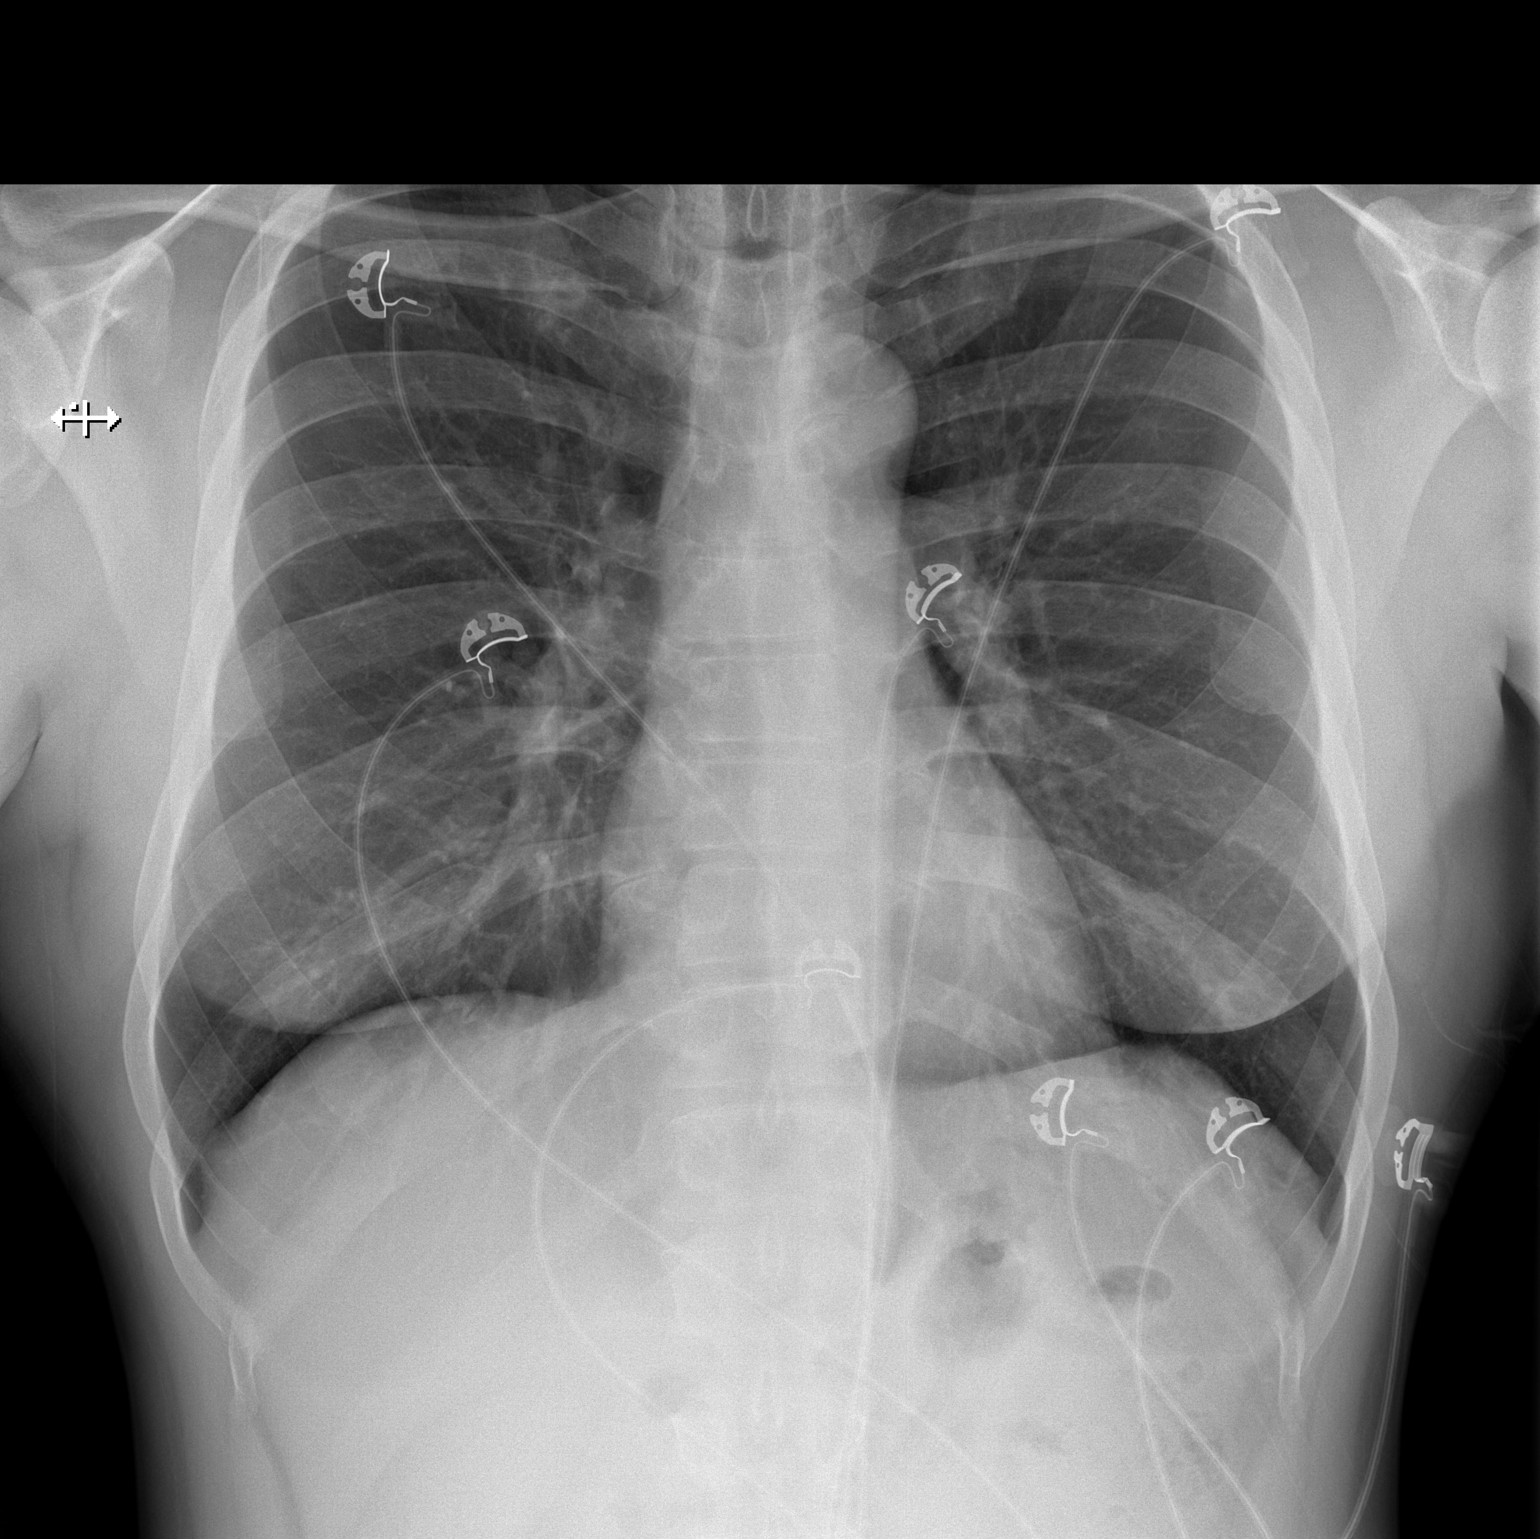

[w chest lat]
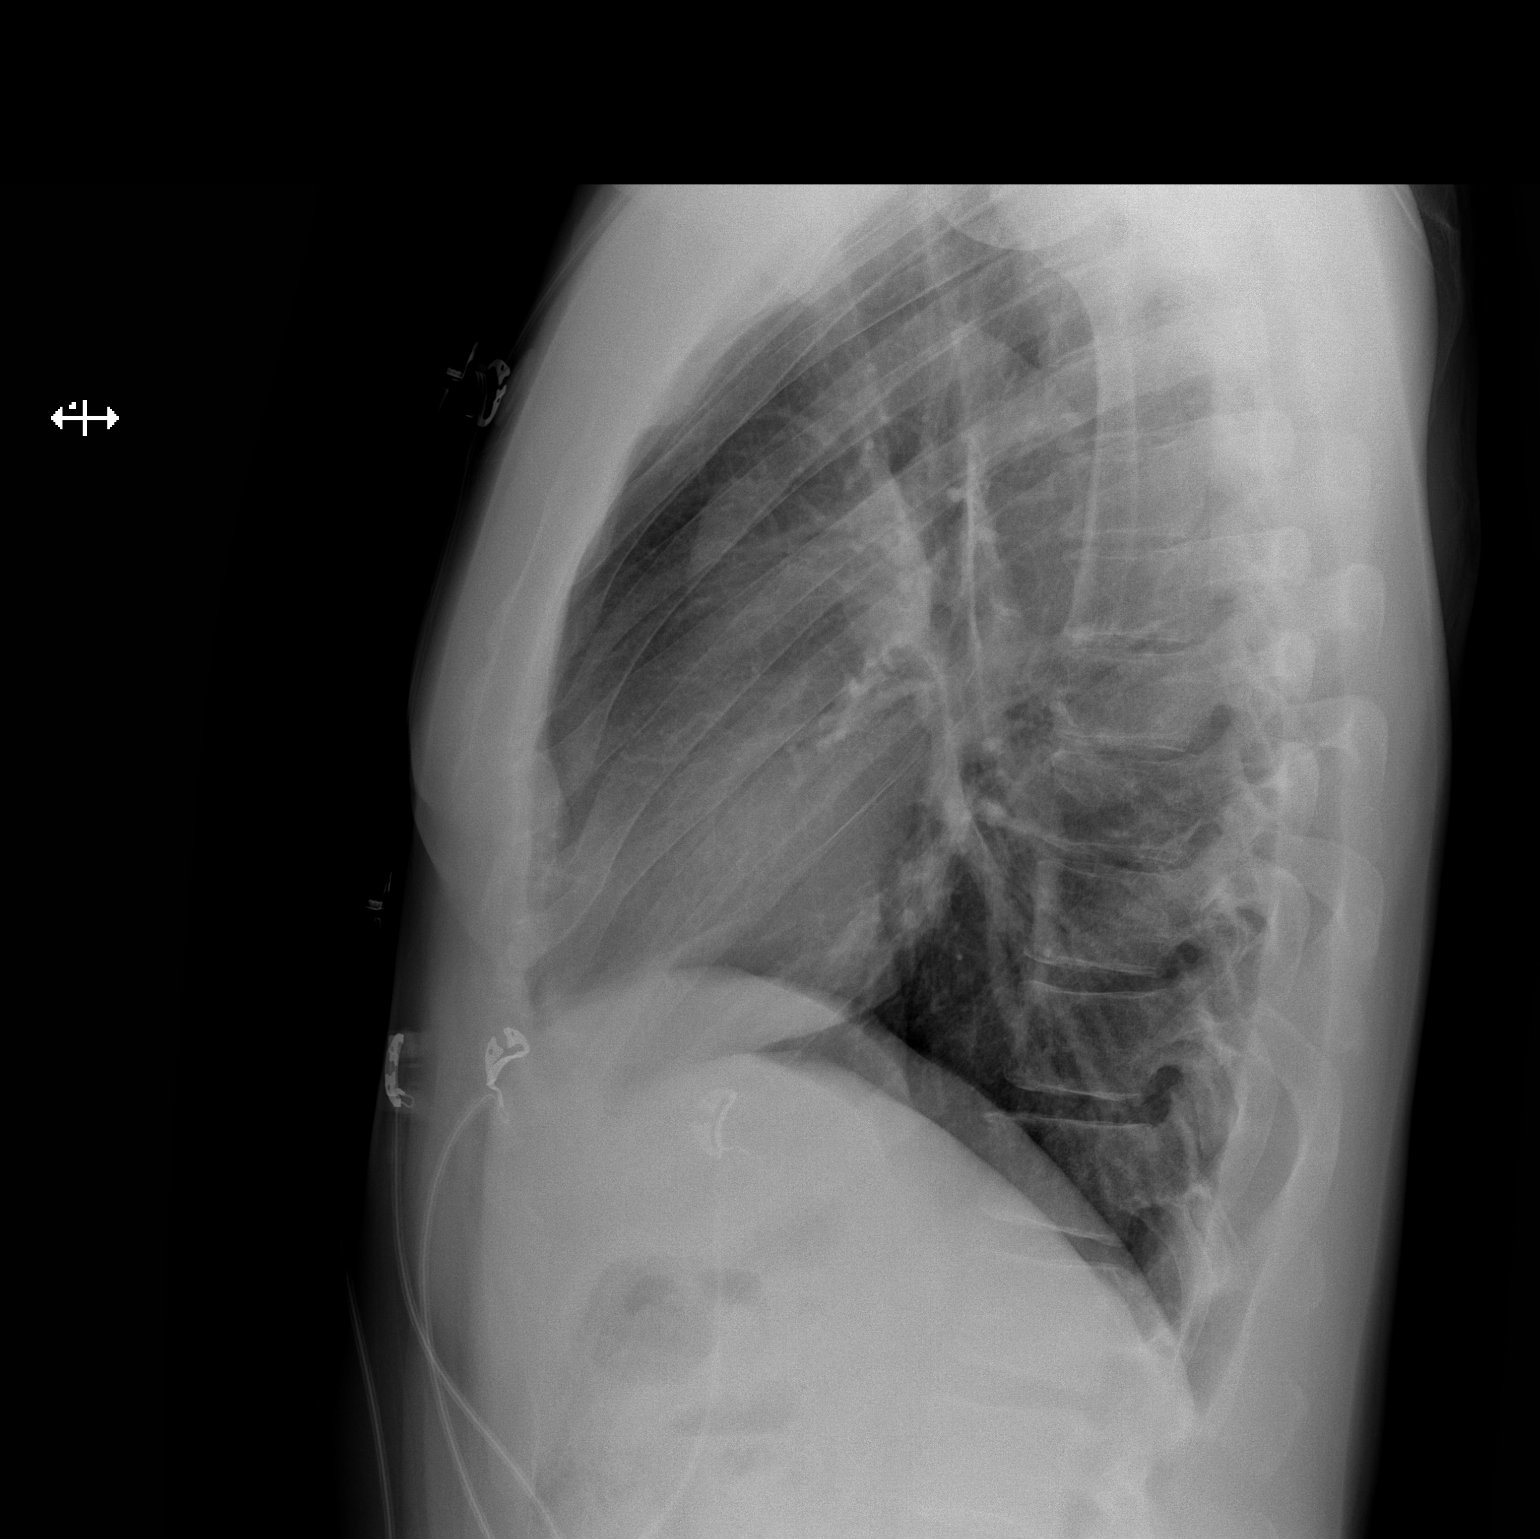

[2 of 2 positions shown; findings below may reference images not displayed]

FINDINGS: There is no edema or consolidation. The heart size pulmonary
vascularity are normal. No pneumothorax. No adenopathy. No bone
lesions.
IMPRESSION: No edema or consolidation.
